# Patient Record
Sex: Male | Born: 1950
Health system: Southern US, Community
[De-identification: ages and names within clinical notes are randomized; demographics above are authoritative.]

## PROBLEM LIST (undated history)

## (undated) DIAGNOSIS — E781 Pure hyperglyceridemia: Secondary | ICD-10-CM

## (undated) DIAGNOSIS — Z125 Encounter for screening for malignant neoplasm of prostate: Secondary | ICD-10-CM

## (undated) DIAGNOSIS — M109 Gout, unspecified: Secondary | ICD-10-CM

## (undated) DIAGNOSIS — Z1211 Encounter for screening for malignant neoplasm of colon: Secondary | ICD-10-CM

## (undated) DIAGNOSIS — I10 Essential (primary) hypertension: Secondary | ICD-10-CM

## (undated) DIAGNOSIS — C689 Malignant neoplasm of urinary organ, unspecified: Secondary | ICD-10-CM

## (undated) HISTORY — DX: Gout, unspecified: M10.9

## (undated) HISTORY — DX: Encounter for screening for malignant neoplasm of colon: Z12.11

## (undated) HISTORY — DX: Encounter for screening for malignant neoplasm of prostate: Z12.5

## (undated) HISTORY — DX: Malignant neoplasm of urinary organ, unspecified: C68.9

## (undated) HISTORY — DX: Essential (primary) hypertension: I10

## (undated) HISTORY — DX: Pure hyperglyceridemia: E78.1

---

## 1992-04-30 DIAGNOSIS — C689 Malignant neoplasm of urinary organ, unspecified: Secondary | ICD-10-CM

## 1992-04-30 HISTORY — DX: Malignant neoplasm of urinary organ, unspecified: C68.9

## 2006-08-29 LAB — HM COLONOSCOPY: HM Colonoscopy: NORMAL

## 2006-09-09 ENCOUNTER — Ambulatory Visit: Payer: Self-pay | Admitting: Gastroenterology

## 2006-11-18 ENCOUNTER — Ambulatory Visit: Payer: Self-pay | Admitting: Internal Medicine

## 2008-05-15 IMAGING — CR DG LUMBAR SPINE AP/LAT/OBLIQUES W/ FLEX AND EXT
1 series · 5 of 5 positions shown · non-contrast
Comparison: none

REASON FOR EXAM: Pain, numbness
COMMENTS:

[Series 1: view not recorded · 0.17mm/px · 5 of 5 slices shown]
[im 1/5]
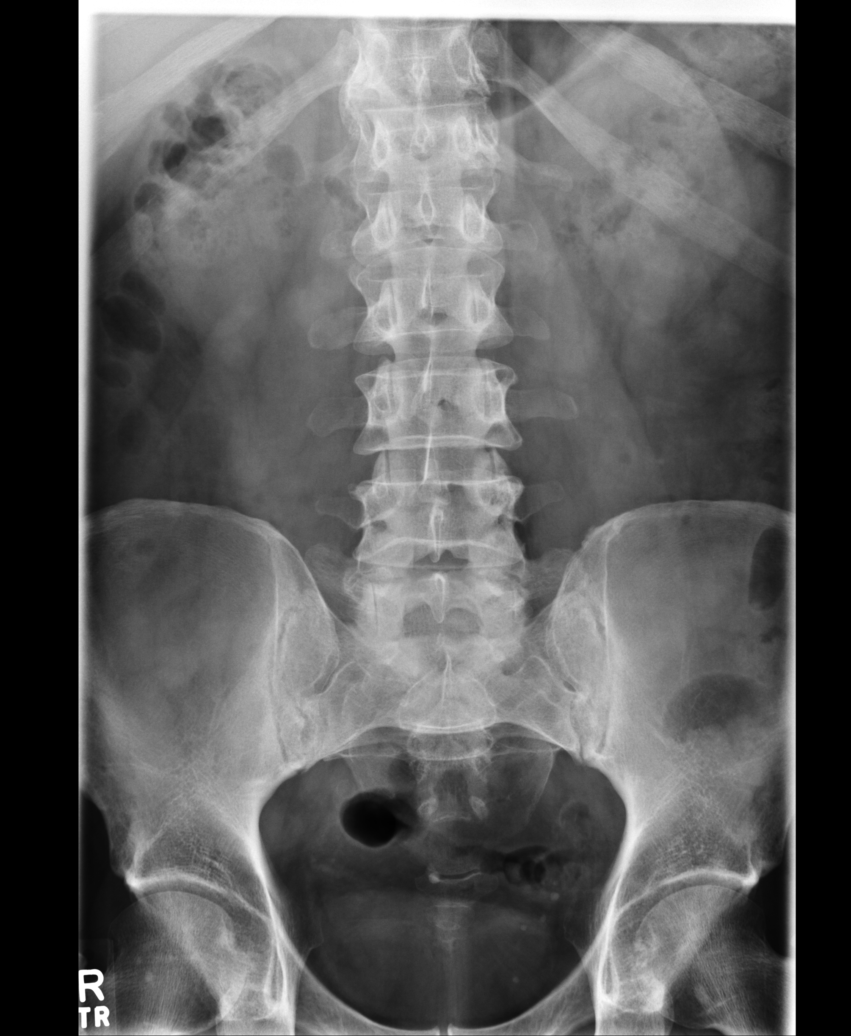
[im 2/5]
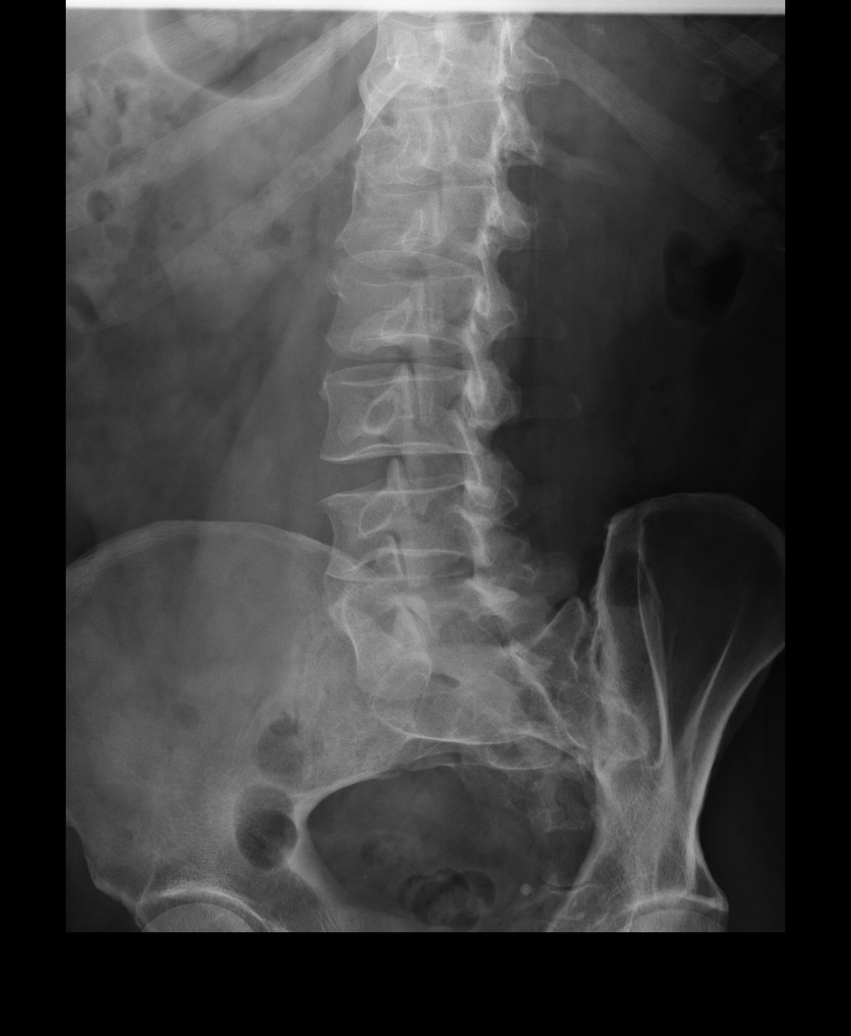
[im 3/5]
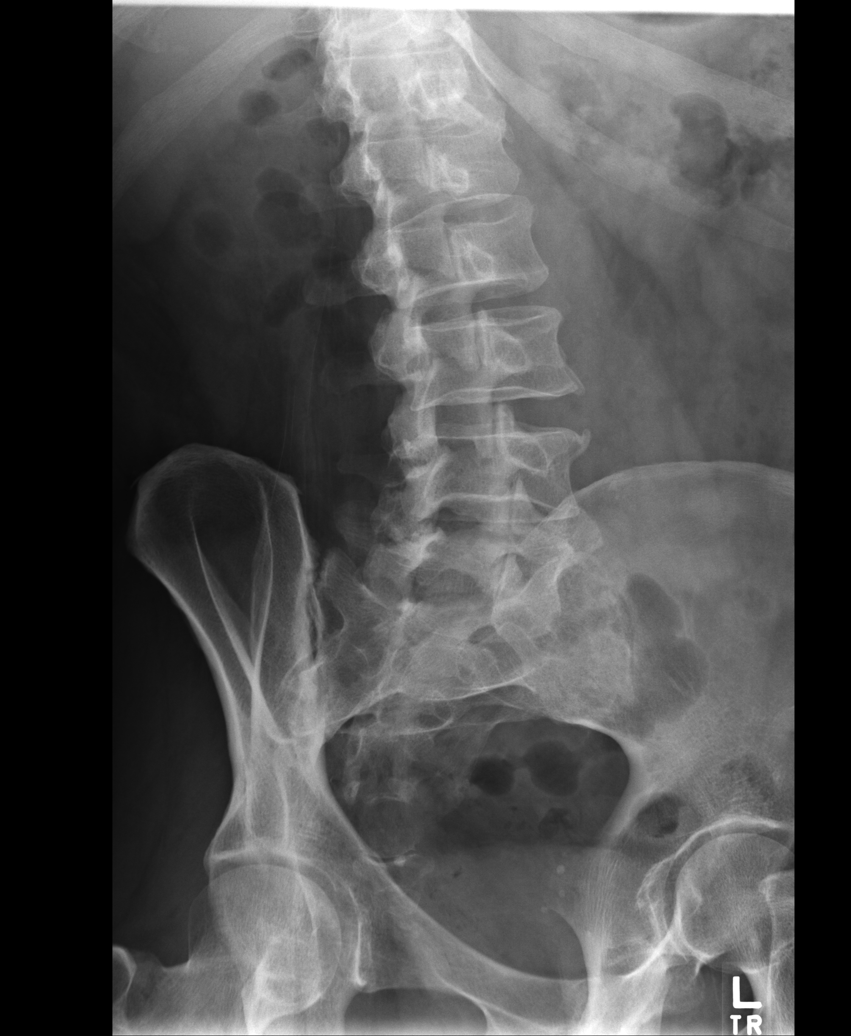
[im 4/5]
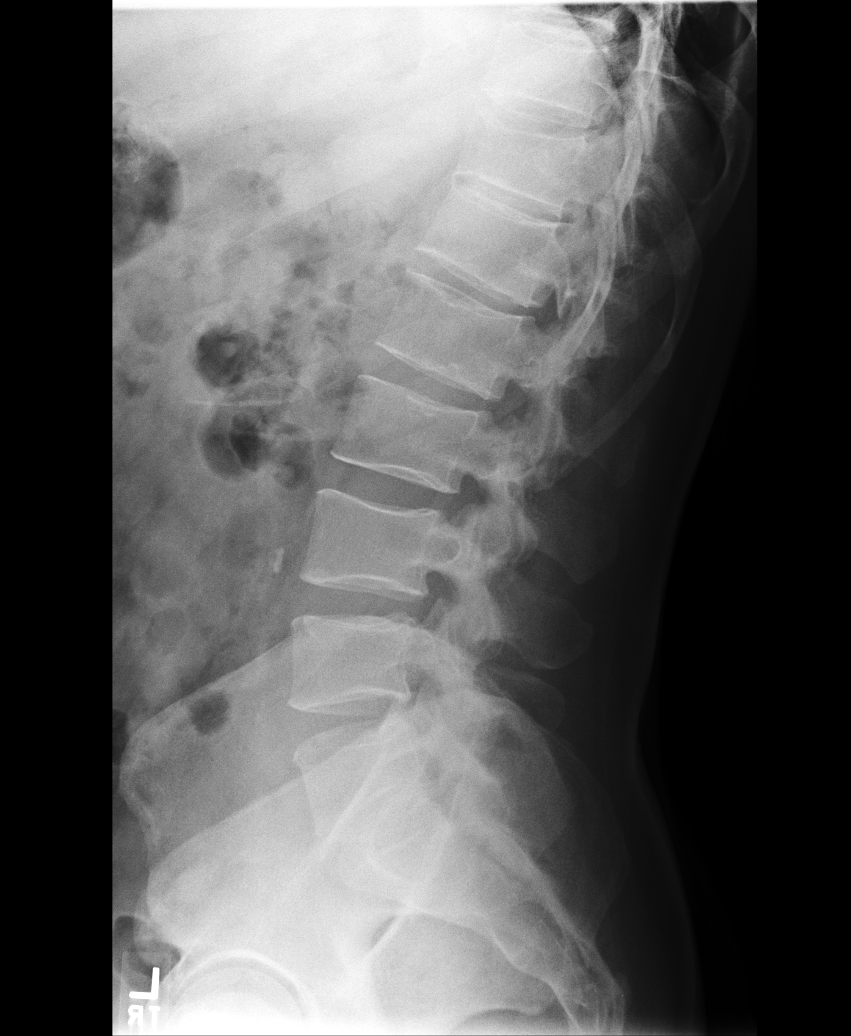
[im 5/5]
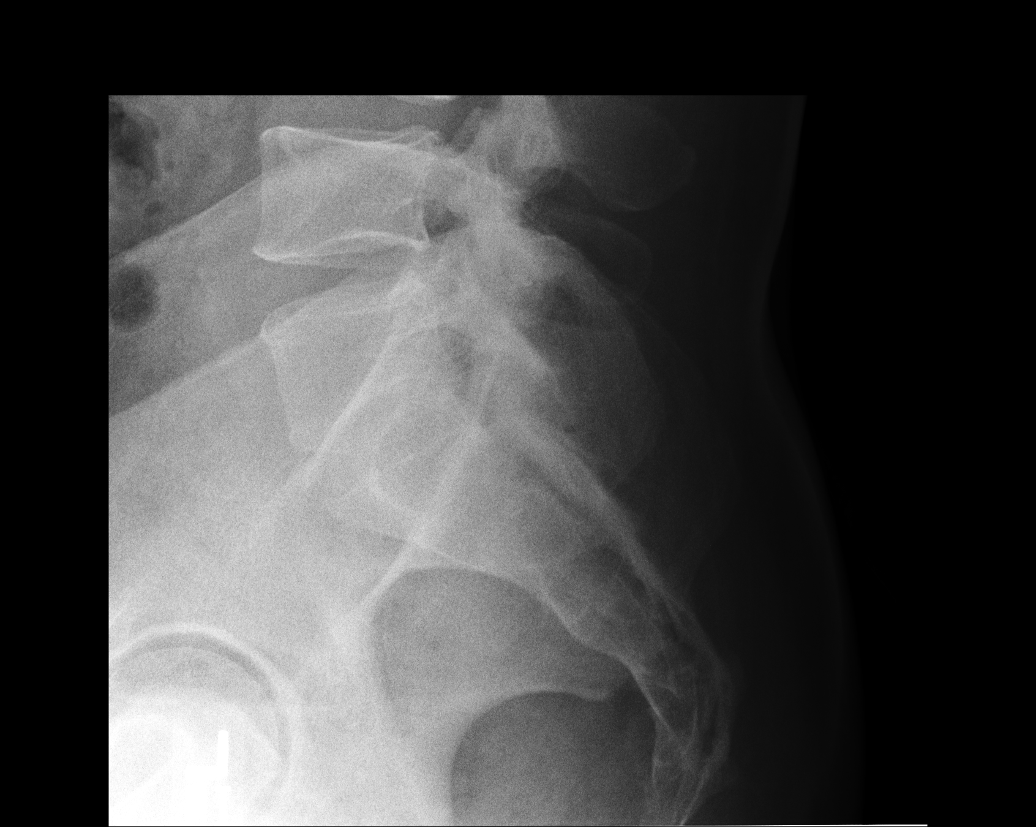

[5 of 5 positions shown; findings below may reference images not displayed]

PROCEDURE:     DXR - DXR LUMBAR SPINE WITH OBLIQUES  - November 18, 2006  [DATE]

RESULT:     The vertebral body heights are well maintained. There is slight
narrowing of the L4-5 intervertebral disc space. The change is minimal but
could represent early manifestation of disc disease. There is also noted
narrowing of the L5-S1 disc space which is likely developmental. Oblique
views show no show significant abnormalities of the articular facets. The
pedicles are intact. There appear to be vestigial ribs at T12.
IMPRESSION: 1.  No fracture is seen.
2.  There is very slight narrowing of the disc space at L4-5. The finding is
minimal but could represent early manifestation of disc disease. This could
be further evaluated by MR, if clinically indicated.

## 2011-01-15 ENCOUNTER — Other Ambulatory Visit: Payer: Self-pay | Admitting: Internal Medicine

## 2011-01-15 MED ORDER — ROSUVASTATIN CALCIUM 10 MG PO TABS: 10.0000 mg | ORAL_TABLET | Freq: Every day | ORAL | Status: DC

## 2011-02-12 ENCOUNTER — Other Ambulatory Visit: Payer: Self-pay | Admitting: Internal Medicine

## 2011-05-16 ENCOUNTER — Other Ambulatory Visit: Payer: Self-pay | Admitting: *Deleted

## 2011-05-16 NOTE — Telephone Encounter (Signed)
Faxed request from [facility] [place].  Pt hasn't been seen in this office and has no upcoming appts.

## 2011-05-16 NOTE — Telephone Encounter (Signed)
Please call p[atient and ask him to make appt and come in ASAP for fasting CMET and lipids,  I will authorized #30 days no refills for now

## 2011-05-22 ENCOUNTER — Other Ambulatory Visit: Payer: Self-pay | Admitting: *Deleted

## 2011-05-23 NOTE — Telephone Encounter (Signed)
Opened in error

## 2011-05-29 ENCOUNTER — Encounter: Payer: Self-pay | Admitting: Internal Medicine

## 2011-05-29 MED ORDER — ROSUVASTATIN CALCIUM 10 MG PO TABS: 10.0000 mg | ORAL_TABLET | Freq: Every day | ORAL | Status: DC

## 2011-05-29 NOTE — Telephone Encounter (Signed)
Advised pt, appt scheduled, medicine sent to pharmacy.

## 2011-05-30 ENCOUNTER — Encounter: Payer: Self-pay | Admitting: Internal Medicine

## 2011-06-13 ENCOUNTER — Ambulatory Visit (INDEPENDENT_AMBULATORY_CARE_PROVIDER_SITE_OTHER): Payer: PRIVATE HEALTH INSURANCE | Admitting: Internal Medicine

## 2011-06-13 ENCOUNTER — Encounter: Payer: Self-pay | Admitting: Internal Medicine

## 2011-06-13 DIAGNOSIS — E781 Pure hyperglyceridemia: Secondary | ICD-10-CM

## 2011-06-13 DIAGNOSIS — C801 Malignant (primary) neoplasm, unspecified: Secondary | ICD-10-CM

## 2011-06-13 DIAGNOSIS — I10 Essential (primary) hypertension: Secondary | ICD-10-CM

## 2011-06-13 DIAGNOSIS — C679 Malignant neoplasm of bladder, unspecified: Secondary | ICD-10-CM

## 2011-06-13 DIAGNOSIS — Z125 Encounter for screening for malignant neoplasm of prostate: Secondary | ICD-10-CM

## 2011-06-13 DIAGNOSIS — Z79899 Other long term (current) drug therapy: Secondary | ICD-10-CM

## 2011-06-13 DIAGNOSIS — C689 Malignant neoplasm of urinary organ, unspecified: Secondary | ICD-10-CM

## 2011-06-13 DIAGNOSIS — J329 Chronic sinusitis, unspecified: Secondary | ICD-10-CM

## 2011-06-13 DIAGNOSIS — Z0000 Encounter for general adult medical examination without abnormal findings: Secondary | ICD-10-CM

## 2011-06-13 DIAGNOSIS — Z1211 Encounter for screening for malignant neoplasm of colon: Secondary | ICD-10-CM

## 2011-06-13 DIAGNOSIS — E785 Hyperlipidemia, unspecified: Secondary | ICD-10-CM

## 2011-06-13 MED ORDER — AMOXICILLIN-POT CLAVULANATE 875-125 MG PO TABS: 1.0000 | ORAL_TABLET | Freq: Two times a day (BID) | ORAL | Status: AC

## 2011-06-13 MED ORDER — AMLODIPINE BESYLATE 10 MG PO TABS: 10.0000 mg | ORAL_TABLET | Freq: Every day | ORAL | Status: DC

## 2011-06-13 MED ORDER — ROSUVASTATIN CALCIUM 10 MG PO TABS: 10.0000 mg | ORAL_TABLET | Freq: Every day | ORAL | Status: DC

## 2011-06-13 NOTE — Patient Instructions (Signed)
I am treating  You with Augmentin for 7 days  ,  Take twice daily with food.  Use Simply Saline twice daily to flush sinuses.  Continue tylenol sinus or  Sudafed PE  10 to 30 mg every 6 hours for congestion.  Substitute Afrin nasal spray for the nighttime dose for 3 to 5  days max  Delsym as needed for cough,  Call for something stronger if needed.

## 2011-06-13 NOTE — Progress Notes (Signed)
Subjective:    Patient ID: Gerald Flores, male    DOB: [birth year], 61 y.o.   MRN: [id number]  HPI  Gerald Flores is a 61 year old white male with a history of former tobacco use gout and arthritis transitional cell carcinoma and hypertension who was last seen over 2012 presents today with chief complaint of recurrent sinus congestion,  Positive sick contacts,   4 or 5 weeks ago had right sided maxillary  Which resolved after one week.  His current episode has lasted 5 days and has progressed to facial pain,  conjunctival injection. purelent sinus drainage, low grade fevers.    Past Medical History  Diagnosis Date  . Special screening for malignant neoplasm of prostate   . Special screening for malignant neoplasms, colon   . Gouty arthritis   . Hypertension   . Hypertriglyceridemia   . Transitional cell carcinoma 1994    treated 1994, Gerald Flores   Current Outpatient Prescriptions on File Prior to Visit  Medication Sig Dispense Refill  . Multiple Vitamin (MULTIVITAMIN) tablet Take 1 tablet by mouth daily.      . Omega-3 Fatty Acids (FISH OIL) 1000 MG CAPS Take by mouth as directed      . tadalafil (CIALIS) 10 MG tablet Take 10 mg by mouth daily as needed.      . vitamin E 400 UNIT capsule Take 400 Units by mouth daily.       Review of Systems  Constitutional: Negative for fever, chills, diaphoresis, activity change, appetite change, fatigue and unexpected weight change.  HENT: Positive for ear pain, congestion, rhinorrhea and sinus pressure. Negative for hearing loss, nosebleeds, sore throat, facial swelling, sneezing, drooling, mouth sores, trouble swallowing, neck pain, neck stiffness, dental problem, voice change, postnasal drip, tinnitus and ear discharge.   Eyes: Positive for pain and redness. Negative for photophobia, discharge, itching and visual disturbance.  Respiratory: Negative for apnea, cough, choking, chest tightness, shortness of breath, wheezing and stridor.   Cardiovascular:  Negative for chest pain, palpitations and leg swelling.  Gastrointestinal: Negative for nausea, vomiting, abdominal pain, diarrhea, constipation, blood in stool, abdominal distention, anal bleeding and rectal pain.  Genitourinary: Negative for dysuria, urgency, frequency, hematuria, flank pain, decreased urine volume, scrotal swelling, difficulty urinating and testicular pain.  Musculoskeletal: Negative for myalgias, back pain, joint swelling, arthralgias and gait problem.  Skin: Negative for color change, rash and wound.  Neurological: Negative for dizziness, tremors, seizures, syncope, speech difficulty, weakness, light-headedness, numbness and headaches.  Psychiatric/Behavioral: Negative for suicidal ideas, hallucinations, behavioral problems, confusion, sleep disturbance, dysphoric mood, decreased concentration and agitation. The patient is not nervous/anxious.        Objective:   Physical Exam  Constitutional: He is oriented to person, place, and time.  HENT:  Head: Normocephalic and atraumatic.  Mouth/Throat: Oropharynx is clear and moist.  Eyes: Conjunctivae and EOM are normal.  Neck: Normal range of motion. Neck supple. No JVD present. No thyromegaly present.  Cardiovascular: Normal rate, regular rhythm and normal heart sounds.   Pulmonary/Chest: Effort normal and breath sounds normal. He has no wheezes. He has no rales.  Abdominal: Soft. Bowel sounds are normal. He exhibits no mass. There is no tenderness. There is no rebound.  Musculoskeletal: Normal range of motion. He exhibits no edema.  Neurological: He is alert and oriented to person, place, and time.  Skin: Skin is warm and dry.  Psychiatric: He has a normal mood and affect.          Assessment &  Plan:   Sinusitis Acute with recurrent symptoms of and prior resolution. We'll treat with Augmentin, decongestants and  is feeling a lot.  Hypertension Elevated today on current regimen, but he is acutely ill and has been  taking over-the-counter decongestants. I will have him recheck his pressure at work and, with several readings. Renal function was normal today  Hypertriglyceridemia Controlled with Crestor. He has not had liver function tests in one year. In her mind these need to be done at a minimum of 6 months to Ensure that he did not sustain liver damage from statin use. Liver functions today were normal.  Transitional cell carcinoma He has not had followup for transitional cell in several years. I have ordered a urinalysis to rule out hematuria and have suggested that he follow up with Gerald Flores annually.   Screening for prostate cancer He has not had a PSA in 2 years. Today's PSA is 1.95 digital rectal exam was deferred as he is not having the symptoms of enlargement.  Screening for colon cancer He normal colonoscopy in 2008 and has no family history of colon cancer. Followup in 2018.    Updated Medication List Outpatient Encounter Prescriptions as of 06/13/2011  Medication Sig Dispense Refill  . amLODipine (NORVASC) 10 MG tablet Take 1 tablet (10 mg total) by mouth daily.  30 tablet  6  . Multiple Vitamin (MULTIVITAMIN) tablet Take 1 tablet by mouth daily.      . Omega-3 Fatty Acids (FISH OIL) 1000 MG CAPS Take by mouth as directed      . rosuvastatin (CRESTOR) 10 MG tablet Take 1 tablet (10 mg total) by mouth at bedtime.  30 tablet  11  . tadalafil (CIALIS) 10 MG tablet Take 10 mg by mouth daily as needed.      Marland Kitchen DISCONTD: amLODipine (NORVASC) 5 MG tablet TAKE 1 TABLET EVERY DAY  30 tablet  6  . DISCONTD: rosuvastatin (CRESTOR) 10 MG tablet Take 1 tablet (10 mg total) by mouth at bedtime.  30 tablet  0  . amoxicillin-clavulanate (AUGMENTIN) 875-125 MG per tablet Take 1 tablet by mouth 2 (two) times daily.  14 tablet  0  . vitamin E 400 UNIT capsule Take 400 Units by mouth daily.

## 2011-06-14 LAB — COMPREHENSIVE METABOLIC PANEL
AST: 27 U/L (ref 0–37)
BUN: 14 mg/dL (ref 6–23)
Calcium: 9.1 mg/dL (ref 8.4–10.5)
Chloride: 106 mEq/L (ref 96–112)
Creatinine, Ser: 0.9 mg/dL (ref 0.4–1.5)
GFR: 89.71 mL/min (ref >60.00)

## 2011-06-14 LAB — URINALYSIS
Bilirubin Urine: NEGATIVE
Leukocytes, UA: NEGATIVE
Nitrite: NEGATIVE
Protein, ur: NEGATIVE mg/dL
Specific Gravity, Urine: 1.009 (ref 1.005–1.030)
Urobilinogen, UA: 0.2 mg/dL (ref 0.0–1.0)

## 2011-06-14 LAB — MICROALBUMIN / CREATININE URINE RATIO: Creatinine,U: 62.2 mg/dL

## 2011-06-14 LAB — LIPID PANEL
LDL Cholesterol: 102 mg/dL — ABNORMAL HIGH (ref 0–99)
Total CHOL/HDL Ratio: 4
VLDL: 20.2 mg/dL (ref 0.0–40.0)

## 2011-06-15 ENCOUNTER — Telehealth: Payer: Self-pay | Admitting: Internal Medicine

## 2011-06-15 NOTE — Telephone Encounter (Signed)
Pt called wanting to get his triiglyceride numbers [phone]

## 2011-06-17 ENCOUNTER — Encounter: Payer: Self-pay | Admitting: Internal Medicine

## 2011-06-17 DIAGNOSIS — I10 Essential (primary) hypertension: Secondary | ICD-10-CM | POA: Insufficient documentation

## 2011-06-17 DIAGNOSIS — J329 Chronic sinusitis, unspecified: Secondary | ICD-10-CM | POA: Insufficient documentation

## 2011-06-17 DIAGNOSIS — C689 Malignant neoplasm of urinary organ, unspecified: Secondary | ICD-10-CM | POA: Insufficient documentation

## 2011-06-17 DIAGNOSIS — E781 Pure hyperglyceridemia: Secondary | ICD-10-CM | POA: Insufficient documentation

## 2011-06-17 DIAGNOSIS — Z125 Encounter for screening for malignant neoplasm of prostate: Secondary | ICD-10-CM | POA: Insufficient documentation

## 2011-06-17 DIAGNOSIS — M109 Gout, unspecified: Secondary | ICD-10-CM | POA: Insufficient documentation

## 2011-06-17 DIAGNOSIS — Z1211 Encounter for screening for malignant neoplasm of colon: Secondary | ICD-10-CM | POA: Insufficient documentation

## 2011-06-17 NOTE — Assessment & Plan Note (Signed)
Elevated today on current regimen, but he is acutely ill and has been taking over-the-counter decongestants. I will have him recheck his pressure at work and, with several readings. Renal function was normal today

## 2011-06-17 NOTE — Assessment & Plan Note (Signed)
Controlled with Crestor. He has not had liver function tests in one year. In her mind these need to be done at a minimum of 6 months to Ensure that he did not sustain liver damage from statin use. Liver functions today were normal.

## 2011-06-17 NOTE — Assessment & Plan Note (Signed)
Acute with recurrent symptoms of and prior resolution. We'll treat with Augmentin, decongestants and  is feeling a lot.

## 2011-06-17 NOTE — Assessment & Plan Note (Signed)
He has not had a PSA in 2 years. Today's PSA is 1.95 digital rectal exam was deferred as he is not having the symptoms of enlargement.

## 2011-06-17 NOTE — Assessment & Plan Note (Addendum)
He has not had followup for transitional cell in several years. I have ordered a urinalysis to rule out hematuria and have suggested that he follow up with Dr. Evelene Croon annually.

## 2011-06-17 NOTE — Assessment & Plan Note (Signed)
He normal colonoscopy in 2008 and has no family history of colon cancer. Followup in 2018.

## 2011-06-18 NOTE — Telephone Encounter (Signed)
Advised pt of trig results, copy of labs placed up front for him to pick up.

## 2011-06-18 NOTE — Telephone Encounter (Signed)
Left message for patient to call.

## 2011-06-25 ENCOUNTER — Other Ambulatory Visit: Payer: Self-pay | Admitting: Internal Medicine

## 2011-06-25 MED ORDER — ROSUVASTATIN CALCIUM 10 MG PO TABS: 10.0000 mg | ORAL_TABLET | Freq: Every day | ORAL | Status: DC

## 2011-06-27 ENCOUNTER — Other Ambulatory Visit: Payer: Self-pay | Admitting: Internal Medicine

## 2011-06-28 ENCOUNTER — Encounter: Payer: Self-pay | Admitting: Internal Medicine

## 2011-09-10 ENCOUNTER — Other Ambulatory Visit: Payer: Self-pay | Admitting: Internal Medicine

## 2011-09-10 MED ORDER — ROSUVASTATIN CALCIUM 10 MG PO TABS: 10.0000 mg | ORAL_TABLET | Freq: Every day | ORAL | Status: DC

## 2012-04-14 ENCOUNTER — Other Ambulatory Visit: Payer: Self-pay | Admitting: Internal Medicine

## 2012-04-14 DIAGNOSIS — E785 Hyperlipidemia, unspecified: Secondary | ICD-10-CM

## 2012-04-14 MED ORDER — ROSUVASTATIN CALCIUM 10 MG PO TABS: 10.0000 mg | ORAL_TABLET | Freq: Every day | ORAL | Status: DC

## 2012-04-14 NOTE — Telephone Encounter (Signed)
30 days only Needs fasting lipids and CMET prior to any more refill.

## 2012-04-14 NOTE — Telephone Encounter (Signed)
°  Refill - Pharmacy -[facility] [address] Fax #[phone] Drug Name-Crestor 10 mg tab Directions- take 1 tab by mouth at bed time Quantity- 30

## 2012-04-14 NOTE — Telephone Encounter (Signed)
Refill request for Crestor 10 mg. No OV since 2/13. Ok for me  to refill?

## 2012-04-16 NOTE — Telephone Encounter (Signed)
Spoke to patient advised him of fasting labs due before any further refills.

## 2012-05-19 ENCOUNTER — Telehealth: Payer: Self-pay | Admitting: General Practice

## 2012-05-19 NOTE — Telephone Encounter (Signed)
Pt.notified

## 2012-05-19 NOTE — Telephone Encounter (Signed)
Pt called wanting to know if it would be ok to take Echinacea with his current medications?

## 2012-05-19 NOTE — Telephone Encounter (Signed)
Yes, and thank him for asking !

## 2012-06-14 ENCOUNTER — Other Ambulatory Visit: Payer: Self-pay

## 2012-09-08 ENCOUNTER — Other Ambulatory Visit: Payer: Self-pay | Admitting: Internal Medicine

## 2012-09-08 NOTE — Telephone Encounter (Signed)
Please advise patient not seen since 2/13

## 2012-12-08 ENCOUNTER — Ambulatory Visit (INDEPENDENT_AMBULATORY_CARE_PROVIDER_SITE_OTHER): Payer: Managed Care, Other (non HMO) | Admitting: Internal Medicine

## 2012-12-08 ENCOUNTER — Encounter: Payer: Self-pay | Admitting: Internal Medicine

## 2012-12-08 VITALS — BP 142/98 | HR 69 | Temp 98.1°F | Resp 12 | Ht 72.0 in | Wt 207.0 lb

## 2012-12-08 DIAGNOSIS — R200 Anesthesia of skin: Secondary | ICD-10-CM

## 2012-12-08 DIAGNOSIS — C801 Malignant (primary) neoplasm, unspecified: Secondary | ICD-10-CM

## 2012-12-08 DIAGNOSIS — Z125 Encounter for screening for malignant neoplasm of prostate: Secondary | ICD-10-CM

## 2012-12-08 DIAGNOSIS — I10 Essential (primary) hypertension: Secondary | ICD-10-CM

## 2012-12-08 DIAGNOSIS — Z1211 Encounter for screening for malignant neoplasm of colon: Secondary | ICD-10-CM

## 2012-12-08 DIAGNOSIS — R5383 Other fatigue: Secondary | ICD-10-CM

## 2012-12-08 DIAGNOSIS — Z79899 Other long term (current) drug therapy: Secondary | ICD-10-CM

## 2012-12-08 DIAGNOSIS — E785 Hyperlipidemia, unspecified: Secondary | ICD-10-CM

## 2012-12-08 DIAGNOSIS — E781 Pure hyperglyceridemia: Secondary | ICD-10-CM

## 2012-12-08 DIAGNOSIS — R202 Paresthesia of skin: Secondary | ICD-10-CM

## 2012-12-08 DIAGNOSIS — C689 Malignant neoplasm of urinary organ, unspecified: Secondary | ICD-10-CM

## 2012-12-08 DIAGNOSIS — Z0000 Encounter for general adult medical examination without abnormal findings: Secondary | ICD-10-CM | POA: Insufficient documentation

## 2012-12-08 DIAGNOSIS — Z8551 Personal history of malignant neoplasm of bladder: Secondary | ICD-10-CM

## 2012-12-08 DIAGNOSIS — R5381 Other malaise: Secondary | ICD-10-CM

## 2012-12-08 DIAGNOSIS — R209 Unspecified disturbances of skin sensation: Secondary | ICD-10-CM

## 2012-12-08 NOTE — Patient Instructions (Addendum)
Return tomorrow for your fasting labs.  i will refill your medications tonight  Please get your fasting labs and follow up visits  every 6 months

## 2012-12-08 NOTE — Progress Notes (Signed)
Patient ID: Gerald Flores, male   DOB: 1950/06/02, 62 y.o.   MRN: [id number]   Patient Active Problem List   Diagnosis Date Noted  . Routine general medical examination at a health care facility 12/08/2012  . Screening for prostate cancer 06/17/2011  . Screening for colon cancer 06/17/2011  . Gouty arthritis   . Hypertension   . Hypertriglyceridemia   . Transitional cell carcinoma     Subjective:  CC:   Chief Complaint  Patient presents with  . Follow-up    for medication    HPI:   Gerald Flores a 62 y.o. male who presents after being lost to follow up on hypertension and hyperlipidemia for over a year.   crestor  and amlodipine was not refilled  due to patient not having labs in over 1.5 years and he has been without them for about a month. Home bps have ben 130 to 140/80 to 90 without medications.    Iritis recovering right eye. Dr. Oren Bracket is treating him with topical prednisone drops.    Following a low carb diet.  History of 40 lb wt loss on low carb diet with regaining of most of his weight.   Last eye exam this year.       Past Medical History  Diagnosis Date  . Special screening for malignant neoplasm of prostate   . Special screening for malignant neoplasms, colon   . Gouty arthritis   . Hypertension   . Hypertriglyceridemia   . Transitional cell carcinoma 1994    treated 1994, Dr. Evelene Croon    History reviewed. No pertinent past surgical history.     The following portions of the patient's history were reviewed and updated as appropriate: Allergies, current medications, and problem list.    Review of Systems:   12 Pt  review of systems was negative except those addressed in the HPI,     History   Social History  . Marital Status: Married    Spouse Name: N/A    Number of Children: N/A  . Years of Education: N/A   Occupational History  . second shift supervisor    Social History Main Topics  . Smoking status: Former Smoker    Types: Pipe,  Software engineer  . Smokeless tobacco: Not on file     Comment: Quit 04/2006  . Alcohol Use: No  . Drug Use: Not on file  . Sexually Active: Not on file   Other Topics Concern  . Not on file   Social History Narrative   Lives with spouse, married May, 2009    Objective:  Filed Vitals:   12/08/12 1542  BP: 142/98  Pulse: 69  Temp: 98.1 F (36.7 C)  Resp: 12     General appearance: alert, cooperative and appears stated age Ears: normal TM's and external ear canals both ears Throat: lips, mucosa, and tongue normal; teeth and gums normal Neck: no adenopathy, no carotid bruit, supple, symmetrical, trachea midline and thyroid not enlarged, symmetric, no tenderness/mass/nodules Back: symmetric, no curvature. ROM normal. No CVA tenderness. Lungs: clear to auscultation bilaterally Heart: regular rate and rhythm, S1, S2 normal, no murmur, click, rub or gallop Abdomen: soft, non-tender; bowel sounds normal; no masses,  no organomegaly Pulses: 2+ and symmetric Skin: Skin color, texture, turgor normal. No rashes or lesions Lymph nodes: Cervical, supraclavicular, and axillary nodes normal.  Assessment and Plan:  Transitional cell carcinoma He has no had follow up with urology as advised last year.  Checking  UA today  Screening for prostate cancer He has deferred prostate exam,  No symptoms.  PSA ordered.  Hypertriglyceridemia Managed with crestor in the past. Statin stopped due to loss to follow up.  repeat lipids,  untreated, tomorrow  Hypertension Resume amlodipine ,  Checking BMET tomorrow  Routine general medical examination at a health care facility Annual male exam was done excluding testicular and prostate exam. PSA is pending .  Colon ca screening was reviewed home IFOB advised annually until next colonoscopy.    Updated Medication List Outpatient Encounter Prescriptions as of 12/08/2012  Medication Sig Dispense Refill  . amLODipine (NORVASC) 10 MG tablet TAKE 1 TABLET (10 MG  TOTAL) BY MOUTH DAILY.  30 tablet  4  . rosuvastatin (CRESTOR) 10 MG tablet Take 1 tablet (10 mg total) by mouth at bedtime.  30 tablet  0  . tadalafil (CIALIS) 10 MG tablet Take 10 mg by mouth daily as needed.      . Multiple Vitamin (MULTIVITAMIN) tablet Take 1 tablet by mouth daily.      . Omega-3 Fatty Acids (FISH OIL) 1000 MG CAPS Take by mouth as directed      . vitamin E 400 UNIT capsule Take 400 Units by mouth daily.       No facility-administered encounter medications on file as of 12/08/2012.     Orders Placed This Encounter  Procedures  . Fecal occult blood, imunochemical  . Urinalysis, Routine w reflex microscopic  . TSH  . CBC with Differential  . Comprehensive metabolic panel  . Lipid panel  . PSA  . B12 and Folate Panel    No Follow-up on file.

## 2012-12-08 NOTE — Assessment & Plan Note (Signed)
He has no had follow up with urology as advised last year.  Checking UA today

## 2012-12-08 NOTE — Assessment & Plan Note (Signed)
Managed with crestor in the past. Statin stopped due to loss to follow up.  repeat lipids,  untreated, tomorrow

## 2012-12-08 NOTE — Assessment & Plan Note (Signed)
Resume amlodipine ,  Checking BMET tomorrow

## 2012-12-08 NOTE — Assessment & Plan Note (Signed)
Annual male exam was done excluding testicular and prostate exam. PSA is pending .  Colon ca screening was reviewed home IFOB advised annually until next colonoscopy.

## 2012-12-08 NOTE — Assessment & Plan Note (Signed)
He has deferred prostate exam,  No symptoms.  PSA ordered.

## 2012-12-09 ENCOUNTER — Other Ambulatory Visit (INDEPENDENT_AMBULATORY_CARE_PROVIDER_SITE_OTHER): Payer: Managed Care, Other (non HMO)

## 2012-12-09 ENCOUNTER — Telehealth: Payer: Self-pay | Admitting: Internal Medicine

## 2012-12-09 DIAGNOSIS — R200 Anesthesia of skin: Secondary | ICD-10-CM

## 2012-12-09 DIAGNOSIS — Z79899 Other long term (current) drug therapy: Secondary | ICD-10-CM

## 2012-12-09 DIAGNOSIS — Z125 Encounter for screening for malignant neoplasm of prostate: Secondary | ICD-10-CM

## 2012-12-09 DIAGNOSIS — E785 Hyperlipidemia, unspecified: Secondary | ICD-10-CM

## 2012-12-09 DIAGNOSIS — R209 Unspecified disturbances of skin sensation: Secondary | ICD-10-CM

## 2012-12-09 DIAGNOSIS — R5381 Other malaise: Secondary | ICD-10-CM

## 2012-12-09 DIAGNOSIS — R5383 Other fatigue: Secondary | ICD-10-CM

## 2012-12-09 LAB — COMPREHENSIVE METABOLIC PANEL
ALT: 19 U/L (ref 0–53)
AST: 21 U/L (ref 0–37)
Alkaline Phosphatase: 51 U/L (ref 39–117)
Calcium: 9.1 mg/dL (ref 8.4–10.5)
Chloride: 103 mEq/L (ref 96–112)
Creatinine, Ser: 1 mg/dL (ref 0.4–1.5)

## 2012-12-09 LAB — B12 AND FOLATE PANEL: Vitamin B-12: 303 pg/mL (ref 211–911)

## 2012-12-09 LAB — CBC WITH DIFFERENTIAL/PLATELET
Basophils Absolute: 0.1 10*3/uL (ref 0.0–0.1)
Eosinophils Absolute: 0.1 10*3/uL (ref 0.0–0.7)
Hemoglobin: 14 g/dL (ref 13.0–17.0)
Lymphocytes Relative: 33 % (ref 12.0–46.0)
MCHC: 33.2 g/dL (ref 30.0–36.0)
Neutro Abs: 2.2 10*3/uL (ref 1.4–7.7)
Neutrophils Relative %: 53.1 % (ref 43.0–77.0)
RDW: 12.4 % (ref 11.5–14.6)

## 2012-12-09 LAB — LIPID PANEL
HDL: 41.1 mg/dL (ref >39.00)
Total CHOL/HDL Ratio: 6
VLDL: 35.6 mg/dL (ref 0.0–40.0)

## 2012-12-09 LAB — URINALYSIS, ROUTINE W REFLEX MICROSCOPIC
Bilirubin Urine: NEGATIVE
Ketones, ur: NEGATIVE
Leukocytes, UA: NEGATIVE
Nitrite: NEGATIVE
Specific Gravity, Urine: <=1.005 (ref 1.000–1.030)
Urobilinogen, UA: 0.2 (ref 0.0–1.0)

## 2012-12-09 LAB — PSA: PSA: 2.08 ng/mL (ref 0.10–4.00)

## 2012-12-09 NOTE — Telephone Encounter (Signed)
Pt would like his crestor and his bp meds sent to [facility] in [place] Pt is out of his meds. He thought they were called in yesterday.

## 2012-12-10 ENCOUNTER — Encounter: Payer: Self-pay | Admitting: Internal Medicine

## 2012-12-10 ENCOUNTER — Other Ambulatory Visit: Payer: Self-pay | Admitting: Internal Medicine

## 2012-12-10 DIAGNOSIS — E785 Hyperlipidemia, unspecified: Secondary | ICD-10-CM

## 2012-12-10 MED ORDER — ROSUVASTATIN CALCIUM 20 MG PO TABS: 20.0000 mg | ORAL_TABLET | Freq: Every day | ORAL | Status: DC

## 2012-12-10 MED ORDER — AMLODIPINE BESYLATE 10 MG PO TABS: ORAL_TABLET | ORAL | Status: DC

## 2012-12-10 NOTE — Telephone Encounter (Signed)
Scripts sent as requested

## 2013-03-05 ENCOUNTER — Other Ambulatory Visit: Payer: Self-pay

## 2013-05-14 ENCOUNTER — Other Ambulatory Visit: Payer: Self-pay | Admitting: *Deleted

## 2013-05-14 MED ORDER — AMLODIPINE BESYLATE 10 MG PO TABS: ORAL_TABLET | ORAL | Status: DC

## 2013-06-10 ENCOUNTER — Other Ambulatory Visit: Payer: Managed Care, Other (non HMO)

## 2013-06-15 ENCOUNTER — Ambulatory Visit: Payer: Managed Care, Other (non HMO) | Admitting: Internal Medicine

## 2013-06-16 ENCOUNTER — Other Ambulatory Visit: Payer: Managed Care, Other (non HMO)

## 2013-06-23 ENCOUNTER — Ambulatory Visit: Payer: Managed Care, Other (non HMO) | Admitting: Internal Medicine

## 2013-06-26 ENCOUNTER — Telehealth: Payer: Self-pay | Admitting: *Deleted

## 2013-06-26 DIAGNOSIS — D72819 Decreased white blood cell count, unspecified: Secondary | ICD-10-CM

## 2013-06-26 DIAGNOSIS — E559 Vitamin D deficiency, unspecified: Secondary | ICD-10-CM

## 2013-06-26 DIAGNOSIS — E785 Hyperlipidemia, unspecified: Secondary | ICD-10-CM

## 2013-06-26 DIAGNOSIS — Z79899 Other long term (current) drug therapy: Secondary | ICD-10-CM

## 2013-06-26 NOTE — Telephone Encounter (Signed)
Pt is coming in on Monday what labs and dx? 

## 2013-06-29 ENCOUNTER — Other Ambulatory Visit (INDEPENDENT_AMBULATORY_CARE_PROVIDER_SITE_OTHER): Payer: Managed Care, Other (non HMO)

## 2013-06-29 DIAGNOSIS — E559 Vitamin D deficiency, unspecified: Secondary | ICD-10-CM

## 2013-06-29 DIAGNOSIS — E785 Hyperlipidemia, unspecified: Secondary | ICD-10-CM

## 2013-06-29 DIAGNOSIS — D72819 Decreased white blood cell count, unspecified: Secondary | ICD-10-CM

## 2013-06-29 DIAGNOSIS — Z79899 Other long term (current) drug therapy: Secondary | ICD-10-CM

## 2013-06-30 LAB — LIPID PANEL
CHOL/HDL RATIO: 6
Cholesterol: 253 mg/dL — ABNORMAL HIGH (ref 0–200)
HDL: 43.9 mg/dL (ref >39.00)
LDL Cholesterol: 170 mg/dL — ABNORMAL HIGH (ref 0–99)
Triglycerides: 195 mg/dL — ABNORMAL HIGH (ref 0.0–149.0)
VLDL: 39 mg/dL (ref 0.0–40.0)

## 2013-06-30 LAB — CBC WITH DIFFERENTIAL/PLATELET
BASOS ABS: 0.1 10*3/uL (ref 0.0–0.1)
Basophils Relative: 1.1 % (ref 0.0–3.0)
EOS ABS: 0.1 10*3/uL (ref 0.0–0.7)
Eosinophils Relative: 1.9 % (ref 0.0–5.0)
HCT: 42.8 % (ref 39.0–52.0)
Hemoglobin: 14.3 g/dL (ref 13.0–17.0)
Lymphocytes Relative: 26.2 % (ref 12.0–46.0)
Lymphs Abs: 1.6 10*3/uL (ref 0.7–4.0)
MCHC: 33.3 g/dL (ref 30.0–36.0)
MCV: 91.2 fl (ref 78.0–100.0)
Monocytes Absolute: 0.5 10*3/uL (ref 0.1–1.0)
Monocytes Relative: 7.4 % (ref 3.0–12.0)
NEUTROS ABS: 3.9 10*3/uL (ref 1.4–7.7)
Neutrophils Relative %: 63.4 % (ref 43.0–77.0)
Platelets: 257 10*3/uL (ref 150.0–400.0)
RBC: 4.69 Mil/uL (ref 4.22–5.81)
RDW: 12.8 % (ref 11.5–14.6)
WBC: 6.1 10*3/uL (ref 4.5–10.5)

## 2013-06-30 LAB — VITAMIN D 25 HYDROXY (VIT D DEFICIENCY, FRACTURES): VIT D 25 HYDROXY: 41 ng/mL (ref 30–89)

## 2013-06-30 LAB — COMPREHENSIVE METABOLIC PANEL
ALT: 17 U/L (ref 0–53)
AST: 21 U/L (ref 0–37)
Albumin: 4 g/dL (ref 3.5–5.2)
Alkaline Phosphatase: 53 U/L (ref 39–117)
BILIRUBIN TOTAL: 0.9 mg/dL (ref 0.3–1.2)
BUN: 16 mg/dL (ref 6–23)
CALCIUM: 9.3 mg/dL (ref 8.4–10.5)
CHLORIDE: 104 meq/L (ref 96–112)
CO2: 28 mEq/L (ref 19–32)
CREATININE: 0.9 mg/dL (ref 0.4–1.5)
GFR: 90.27 mL/min (ref >60.00)
Glucose, Bld: 82 mg/dL (ref 70–99)
Potassium: 4.4 mEq/L (ref 3.5–5.1)
SODIUM: 138 meq/L (ref 135–145)
TOTAL PROTEIN: 7.1 g/dL (ref 6.0–8.3)

## 2013-07-01 ENCOUNTER — Encounter: Payer: Self-pay | Admitting: Internal Medicine

## 2013-07-01 ENCOUNTER — Ambulatory Visit (INDEPENDENT_AMBULATORY_CARE_PROVIDER_SITE_OTHER): Payer: Managed Care, Other (non HMO) | Admitting: Internal Medicine

## 2013-07-01 VITALS — BP 172/104 | HR 67 | Temp 98.1°F | Resp 16 | Wt 219.5 lb

## 2013-07-01 DIAGNOSIS — E781 Pure hyperglyceridemia: Secondary | ICD-10-CM

## 2013-07-01 DIAGNOSIS — I10 Essential (primary) hypertension: Secondary | ICD-10-CM

## 2013-07-01 DIAGNOSIS — Z23 Encounter for immunization: Secondary | ICD-10-CM

## 2013-07-01 DIAGNOSIS — E785 Hyperlipidemia, unspecified: Secondary | ICD-10-CM

## 2013-07-01 DIAGNOSIS — R635 Abnormal weight gain: Secondary | ICD-10-CM

## 2013-07-01 MED ORDER — COENZYME Q10 30 MG PO CAPS
30.0000 mg | ORAL_CAPSULE | Freq: Every day | ORAL | Status: DC
Start: 2013-07-01 — End: 2015-09-27

## 2013-07-01 MED ORDER — AMLODIPINE BESYLATE 10 MG PO TABS: ORAL_TABLET | ORAL | Status: DC

## 2013-07-01 MED ORDER — ROSUVASTATIN CALCIUM 20 MG PO TABS: 20.0000 mg | ORAL_TABLET | Freq: Every day | ORAL | Status: DC

## 2013-07-01 NOTE — Progress Notes (Signed)
Patient ID: Gerald Flores, male   DOB: 05/28/1950, 63 y.o.   MRN: [id number]  Patient Active Problem List   Diagnosis Date Noted  . Weight gain 07/04/2013  . Routine general medical examination at a health care facility 12/08/2012  . Screening for prostate cancer 06/17/2011  . Screening for colon cancer 06/17/2011  . Gouty arthritis   . Hypertension   . Hypertriglyceridemia   . Transitional cell carcinoma     Subjective:  CC:   Chief Complaint  Patient presents with  . Follow-up    general follow up    HPI:   Gerald Flores is a 63 y.o. male who presents for Follow up on chronic conditions including hypertension and hyperlipidemia.  Last seen August 2014,  Has run out of his BP medications as of 6 weeks ago.  Cc: Low back pain  Has been a problem for several months.  The pain is nonradiating./  Improving with [facility] chiropractic manipulation.    He has not taking crestor due to recurrent myalgias which returned with repeat trial .  He also states that he has not hiad his  bp meds in 6 weeks    Past Medical History  Diagnosis Date  . Special screening for malignant neoplasm of prostate   . Special screening for malignant neoplasms, colon   . Gouty arthritis   . Hypertension   . Hypertriglyceridemia   . Transitional cell carcinoma 1994    treated 1994, Dr. Yves Dill    No past surgical history on file.     The following portions of the patient's history were reviewed and updated as appropriate: Allergies, current medications, and problem list.    Review of Systems:   Patient denies headache, fevers, malaise, unintentional weight loss, skin rash, eye pain, sinus congestion and sinus pain, sore throat, dysphagia,  hemoptysis , cough, dyspnea, wheezing, chest pain, palpitations, orthopnea, edema, abdominal pain, nausea, melena, diarrhea, constipation, flank pain, dysuria, hematuria, urinary  Frequency, nocturia, numbness, tingling, seizures,  Focal weakness, Loss of  consciousness,  Tremor, insomnia, depression, anxiety, and suicidal ideation.     History   Social History  . Marital Status: Married    Spouse Name: N/A    Number of Children: N/A  . Years of Education: N/A   Occupational History  . second shift supervisor    Social History Main Topics  . Smoking status: Former Smoker    Types: Pipe, Landscape architect  . Smokeless tobacco: Not on file     Comment: Quit 04/2006  . Alcohol Use: No  . Drug Use: Not on file  . Sexual Activity: Not on file   Other Topics Concern  . Not on file   Social History Narrative   Lives with spouse, married May, 2009    Objective:  Filed Vitals:   07/01/13 1636  BP: 172/104  Pulse:   Temp:   Resp:      General appearance: alert, cooperative and appears stated age Ears: normal TM's and external ear canals both ears Throat: lips, mucosa, and tongue normal; teeth and gums normal Neck: no adenopathy, no carotid bruit, supple, symmetrical, trachea midline and thyroid not enlarged, symmetric, no tenderness/mass/nodules Back: symmetric, no curvature. ROM normal. No CVA tenderness. Lungs: clear to auscultation bilaterally Heart: regular rate and rhythm, S1, S2 normal, no murmur, click, rub or gallop Abdomen: soft, non-tender; bowel sounds normal; no masses,  no organomegaly Pulses: 2+ and symmetric Skin: Skin color, texture, turgor normal. No rashes or lesions Lymph  nodes: Cervical, supraclavicular, and axillary nodes normal.  Assessment and Plan:  Hypertriglyceridemia He did not tolerate daily Crestor due to myalgias.  Repeat lipids are improved and trigs are < 200 ,  Ldl 170.  He will try adding coQ 10 and taking the Crestor every other day Lab Results  Component Value Date   CHOL 253* 06/29/2013   HDL 43.90 06/29/2013   LDLCALC 170* 06/29/2013   LDLDIRECT 136.4 12/09/2012   TRIG 195.0* 06/29/2013   CHOLHDL 6 06/29/2013    Hypertension  Historically well controlled on current regimen but currently  untreated.  Samples of Bystolic given , 10 mg daily until mail order amlodipine . Renal function stable, no changes today.  Lab Results  Component Value Date   CREATININE 0.9 06/29/2013   Lab Results  Component Value Date   NA 138 06/29/2013   K 4.4 06/29/2013   CL 104 06/29/2013   CO2 28 06/29/2013     Weight gain I have addressed  BMI and recommended wt loss of 10% of body weigh over the next 6 months using a low glycemic index diet and regular exercise a minimum of 5 days per week.   A total of 25 minutes was spent with patient more than half of which was spent in counseling, reviewing records from other prviders and coordination of care.   Updated Medication List Outpatient Encounter Prescriptions as of 07/01/2013  Medication Sig  . amLODipine (NORVASC) 10 MG tablet TAKE 1 TABLET (10 MG TOTAL) BY MOUTH DAILY.  . Multiple Vitamin (MULTIVITAMIN) tablet Take 1 tablet by mouth daily.  . Omega-3 Fatty Acids (FISH OIL) 1000 MG CAPS Take by mouth as directed  . rosuvastatin (CRESTOR) 20 MG tablet Take 1 tablet (20 mg total) by mouth at bedtime.  . tadalafil (CIALIS) 10 MG tablet Take 10 mg by mouth daily as needed.  . vitamin E 400 UNIT capsule Take 400 Units by mouth daily.  . [DISCONTINUED] amLODipine (NORVASC) 10 MG tablet TAKE 1 TABLET (10 MG TOTAL) BY MOUTH DAILY.  . [DISCONTINUED] rosuvastatin (CRESTOR) 20 MG tablet Take 1 tablet (20 mg total) by mouth at bedtime.  Marland Kitchen co-enzyme Q-10 30 MG capsule Take 1 capsule (30 mg total) by mouth daily.     Orders Placed This Encounter  Procedures  . Tdap vaccine greater than or equal to 7yo IM    Return in about 6 months (around 01/01/2014).

## 2013-07-01 NOTE — Patient Instructions (Signed)
Let's resume crestor every other day and add daily co Q 10 (supplement0 to help the muscles  Please  Take bystolic 10 mg (that's 2 tablets) daily at bedtime until your amlodipine arrives  Your goal blood pressure is 130/80 or less .  Call me if you are not at goal after you resume amlodipine  Repeat labs needed in 2 months (fasting)  Return in 6 months for your annual physical  Ignore your family!! Your wife is right   This is  One version of a  "Low GI"  Diet:  It will still lower your blood sugars and allow you to lose 4 to 8  lbs  per month if you follow it carefully.  Your goal with exercise is a minimum of 30 minutes of aerobic exercise 5 days per week (Walking does not count once it becomes easy!)    All of the foods can be found at grocery stores and in bulk at [employer].  The Atkins protein bars and shakes are available in more varieties at Target, [employer] and [place].     7 AM Breakfast:  Choose from the following:  Low carbohydrate Protein  Shakes (I recommend the EAS AdvantEdge "Carb Control" shakes  Or the low carb shakes by Atkins.    2.5 carbs   Arnold's "Sandwhich Thin"toasted  w/ peanut butter (no jelly: about 20 net carbs  "Bagel Thin" with cream cheese and salmon: about 20 carbs   a scrambled egg/bacon/cheese burrito made with Mission's "carb balance" whole wheat tortilla  (about 10 net carbs )   Avoid cereal and bananas, oatmeal and cream of wheat and grits. They are loaded with carbohydrates!   10 AM: high protein snack  Protein bar by Atkins (the snack size, under 200 cal, usually < 6 net carbs).    A stick of cheese:  Around 1 carb,  100 cal     Dannon Light n Fit [place] Yogurt  (80 cal, 8 carbs)  Other so called "protein bars" and Greek yogurts tend to be loaded with carbohydrates.  Remember, in food advertising, the word "energy" is synonymous for " carbohydrate."  Lunch:   A Sandwich using the bread choices listed, Can use any  Eggs,  lunchmeat, grilled  meat or canned tuna), avocado, regular mayo/mustard  and cheese.  A Salad using blue cheese, ranch,  Goddess or vinagrette,  No croutons or "confetti" and no "candied nuts" but regular nuts OK.   No pretzels or chips.  Pickles and miniature sweet peppers are a good low carb alternative that provide a "crunch"  The bread is the only source of carbohydrate in a sandwich and  can be decreased by trying some of these alternatives to traditional loaf bread  Joseph's makes a pita bread and a flat bread that are 50 cal and 4 net carbs available at [place] and [place].  This can be toasted to use with hummous as well  Toufayan makes a low carb flatbread that's 100 cal and 9 net carbs available at [employer] and [employer]J's makes 2 sizes of  Low carb whole wheat tortilla  (The large one is 210 cal and 6 net carbs) Avoid "Low fat dressings, as well as Barry Brunner and [place] dressings They are loaded with sugar!   3 PM/ Mid day  Snack:  Consider  1 ounce of  almonds, walnuts, pistachios, pecans, peanuts,  Macadamia nuts or a nut medley.  Avoid "granola"; the dried cranberries and  raisins are loaded with carbohydrates. Mixed nuts as long as there are no raisins,  cranberries or dried fruit.     6 PM  Dinner:     Meat/fowl/fish with a green salad, and either broccoli, cauliflower, green beans, spinach, brussel sprouts or  Lima beans. DO NOT BREAD THE PROTEIN!!      There is a low carb pasta by Dreamfield's that is acceptable and tastes great: only 5 digestible carbs/serving.( All grocery stores but BJs carry it )  Try Hurley Cisco Angelo's chicken piccata or chicken or eggplant parm over low carb pasta.(Lowes and BJs)   Marjory Lies Sanchez's "Carnitas" (pulled pork, no sauce,  0 carbs) or his beef pot roast to make a dinner burrito (at [employer]J's)  Pesto over low carb pasta (bj's sells a good quality pesto in the center refrigerated section of the deli   Whole wheat pasta is still full of digestible carbs and  Not as low  in glycemic index as Dreamfield's.   Brown rice is still rice,  So skip the rice and noodles if you eat [place] or [place] (or at least limit to 1/2 cup)  9 PM snack :   Breyer's "low carb" fudgsicle or  ice cream bar (Carb Smart line), or  Weight Watcher's ice cream bar , or another "no sugar added" ice cream;  a serving of fresh berries/cherries with whipped cream   Try layering  DANNON'S LlGHT N FIT GREEK YOGURT with blueberries walnuts and whipped cream for a great dessert  Avoid bananas, pineapple, grapes  and watermelon on a regular basis because they are high in sugar.  THINK OF THEM AS DESSERT  Remember that snack Substitutions should be less than 10 NET carbs per serving and meals < 20 carbs. Remember to subtract fiber grams to get the "net carbs."

## 2013-07-01 NOTE — Progress Notes (Signed)
Pre-visit discussion using our clinic review tool. No additional management support is needed unless otherwise documented below in the visit note.  

## 2013-07-04 DIAGNOSIS — E663 Overweight: Secondary | ICD-10-CM | POA: Insufficient documentation

## 2013-07-04 NOTE — Assessment & Plan Note (Addendum)
Historically well controlled on current regimen but currently untreated.  Samples of Bystolic given , 10 mg daily until mail order amlodipine . Renal function stable, no changes today.  Lab Results  Component Value Date   CREATININE 0.9 06/29/2013   Lab Results  Component Value Date   NA 138 06/29/2013   K 4.4 06/29/2013   CL 104 06/29/2013   CO2 28 06/29/2013

## 2013-07-04 NOTE — Assessment & Plan Note (Addendum)
He did not tolerate daily Crestor due to myalgias.  Repeat lipids are improved and trigs are < 200 ,  Ldl 170.  He will try adding coQ 10 and taking the Crestor every other day Lab Results  Component Value Date   CHOL 253* 06/29/2013   HDL 43.90 06/29/2013   LDLCALC 170* 06/29/2013   LDLDIRECT 136.4 12/09/2012   TRIG 195.0* 06/29/2013   CHOLHDL 6 06/29/2013

## 2013-07-04 NOTE — Assessment & Plan Note (Signed)
I have addressed  BMI and recommended wt loss of 10% of body weigh over the next 6 months using a low glycemic index diet and regular exercise a minimum of 5 days per week.   

## 2013-07-24 ENCOUNTER — Telehealth: Payer: Self-pay | Admitting: *Deleted

## 2013-07-24 DIAGNOSIS — M109 Gout, unspecified: Secondary | ICD-10-CM

## 2013-07-24 MED ORDER — COLCHICINE 0.6 MG PO TABS: 0.6000 mg | ORAL_TABLET | Freq: Two times a day (BID) | ORAL | Status: DC

## 2013-07-24 NOTE — Telephone Encounter (Signed)
Patient called and stated he is having a gout flare up in his right great toe and stated he had discussed this at last visit  And you would call something for gout.

## 2013-07-24 NOTE — Telephone Encounter (Signed)
Please send medication to [facility] [address]

## 2013-07-24 NOTE — Telephone Encounter (Signed)
Colchicine twice daily  For a week,  called in ,  May add tylenol 500 mg every 6 hours  PLEASE suspend his cholesterol medication for two weeks   Starting now

## 2013-07-24 NOTE — Telephone Encounter (Signed)
Patient notified script called. Explained to patient to stop Crestor for two weeks patient voiced understanding.

## 2013-08-31 ENCOUNTER — Other Ambulatory Visit (INDEPENDENT_AMBULATORY_CARE_PROVIDER_SITE_OTHER): Payer: Managed Care, Other (non HMO)

## 2013-08-31 ENCOUNTER — Other Ambulatory Visit: Payer: Self-pay | Admitting: Internal Medicine

## 2013-08-31 DIAGNOSIS — E785 Hyperlipidemia, unspecified: Secondary | ICD-10-CM

## 2013-08-31 DIAGNOSIS — Z79899 Other long term (current) drug therapy: Secondary | ICD-10-CM

## 2013-08-31 LAB — COMPREHENSIVE METABOLIC PANEL
ALT: 28 U/L (ref 0–53)
AST: 38 U/L — ABNORMAL HIGH (ref 0–37)
Albumin: 3.9 g/dL (ref 3.5–5.2)
Alkaline Phosphatase: 55 U/L (ref 39–117)
BILIRUBIN TOTAL: 0.8 mg/dL (ref 0.2–1.2)
BUN: 12 mg/dL (ref 6–23)
CALCIUM: 9.1 mg/dL (ref 8.4–10.5)
CHLORIDE: 106 meq/L (ref 96–112)
CO2: 27 meq/L (ref 19–32)
Creatinine, Ser: 1 mg/dL (ref 0.4–1.5)
GFR: 81.78 mL/min (ref >60.00)
Glucose, Bld: 91 mg/dL (ref 70–99)
Potassium: 4.1 mEq/L (ref 3.5–5.1)
SODIUM: 141 meq/L (ref 135–145)
TOTAL PROTEIN: 6.5 g/dL (ref 6.0–8.3)

## 2013-09-01 LAB — LIPID PANEL W/O CHOL/HDL RATIO
Cholesterol, Total: 175 mg/dL (ref 100–199)
HDL: 50 mg/dL (ref >39)
LDL Calculated: 96 mg/dL (ref 0–99)
Triglycerides: 147 mg/dL (ref 0–149)
VLDL Cholesterol Cal: 29 mg/dL (ref 5–40)

## 2013-09-02 ENCOUNTER — Encounter: Payer: Self-pay | Admitting: Internal Medicine

## 2014-01-01 ENCOUNTER — Encounter: Payer: Managed Care, Other (non HMO) | Admitting: Internal Medicine

## 2014-02-05 ENCOUNTER — Encounter: Payer: Self-pay | Admitting: Internal Medicine

## 2014-02-05 ENCOUNTER — Ambulatory Visit (INDEPENDENT_AMBULATORY_CARE_PROVIDER_SITE_OTHER): Payer: Managed Care, Other (non HMO) | Admitting: Internal Medicine

## 2014-02-05 VITALS — BP 126/76 | HR 86 | Temp 97.7°F | Resp 16 | Ht 72.0 in | Wt 219.2 lb

## 2014-02-05 DIAGNOSIS — Z125 Encounter for screening for malignant neoplasm of prostate: Secondary | ICD-10-CM

## 2014-02-05 DIAGNOSIS — C801 Malignant (primary) neoplasm, unspecified: Secondary | ICD-10-CM

## 2014-02-05 DIAGNOSIS — E559 Vitamin D deficiency, unspecified: Secondary | ICD-10-CM

## 2014-02-05 DIAGNOSIS — I10 Essential (primary) hypertension: Secondary | ICD-10-CM

## 2014-02-05 DIAGNOSIS — Z0000 Encounter for general adult medical examination without abnormal findings: Secondary | ICD-10-CM

## 2014-02-05 DIAGNOSIS — Z1159 Encounter for screening for other viral diseases: Secondary | ICD-10-CM

## 2014-02-05 DIAGNOSIS — C689 Malignant neoplasm of urinary organ, unspecified: Secondary | ICD-10-CM

## 2014-02-05 DIAGNOSIS — Z79899 Other long term (current) drug therapy: Secondary | ICD-10-CM

## 2014-02-05 DIAGNOSIS — E781 Pure hyperglyceridemia: Secondary | ICD-10-CM

## 2014-02-05 DIAGNOSIS — R635 Abnormal weight gain: Secondary | ICD-10-CM

## 2014-02-05 LAB — PSA: PSA: 2.53 ng/mL (ref 0.10–4.00)

## 2014-02-05 LAB — COMPREHENSIVE METABOLIC PANEL
ALBUMIN: 3.7 g/dL (ref 3.5–5.2)
ALT: 27 U/L (ref 0–53)
AST: 33 U/L (ref 0–37)
Alkaline Phosphatase: 71 U/L (ref 39–117)
BUN: 14 mg/dL (ref 6–23)
CHLORIDE: 104 meq/L (ref 96–112)
CO2: 28 mEq/L (ref 19–32)
CREATININE: 0.9 mg/dL (ref 0.4–1.5)
Calcium: 9.3 mg/dL (ref 8.4–10.5)
GFR: 87.84 mL/min (ref >60.00)
GLUCOSE: 94 mg/dL (ref 70–99)
Potassium: 4.2 mEq/L (ref 3.5–5.1)
Sodium: 138 mEq/L (ref 135–145)
Total Bilirubin: 0.9 mg/dL (ref 0.2–1.2)
Total Protein: 7.6 g/dL (ref 6.0–8.3)

## 2014-02-05 LAB — VITAMIN D 25 HYDROXY (VIT D DEFICIENCY, FRACTURES): VITD: 30.35 ng/mL (ref 30.00–100.00)

## 2014-02-05 NOTE — Progress Notes (Signed)
Pre-visit discussion using our clinic review tool. No additional management support is needed unless otherwise documented below in the visit note.  

## 2014-02-05 NOTE — Patient Instructions (Addendum)
This is  my version of a  "Low GI"  Diet:  It will still lower your blood sugars and allow you to lose 4 to 8  lbs  per month if you follow it carefully.  Your goal with exercise is a minimum of 30 minutes of aerobic exercise 5 days per week (Walking does not count once it becomes easy!)     All of the foods can be found at grocery stores and in bulk at [employer].  The Atkins protein bars and shakes are available in more varieties at Target, WalMart and [place].     7 AM Breakfast:  Choose from the following:  Low carbohydrate Protein  Shakes (I recommend the EAS AdvantEdge "Carb Control" shakes  Or the low carb shakes by Atkins.    2.5 carbs   Arnold's "Sandwhich Thin"toasted  w/ peanut butter (no jelly: about 20 net carbs  "Bagel Thin" with cream cheese and salmon: about 20 carbs   a scrambled egg/bacon/cheese burrito made with [employer]'s "carb balance" whole wheat tortilla  (about 10 net carbs )  A slice of home made fritatta (egg based dish without a crust:  google it)    Avoid cereal and bananas, oatmeal and cream of wheat and grits. They are loaded with carbohydrates!   10 AM: high protein snack  Protein bar by Atkins (the snack size, under 200 cal, usually < 6 net carbs).    A stick of cheese:  Around 1 carb,  100 cal     Dannon Light n Fit [place] Yogurt  (80 cal, 8 carbs)  Other so called "protein bars" and Greek yogurts tend to be loaded with carbohydrates.  Remember, in food advertising, the word "energy" is synonymous for " carbohydrate."  Lunch:   A Sandwich using the bread choices listed, Can use any  Eggs,  lunchmeat, grilled meat or canned tuna), avocado, regular mayo/mustard  and cheese.  A Salad using blue cheese, ranch,  Goddess or vinagrette,  No croutons or "confetti" and no "candied nuts" but regular nuts OK.   No pretzels or chips.  Pickles and miniature sweet peppers are a good low carb alternative that provide a "crunch"  The bread is the only source of  carbohydrate in a sandwich and  can be decreased by trying some of these alternatives to traditional loaf bread  Joseph's makes a pita bread and a flat bread that are 50 cal and 4 net carbs available at [place] and [place].  This can be toasted to use with hummous as well  Toufayan makes a low carb flatbread that's 100 cal and 9 net carbs available at [employer] and [employer]J's makes 2 sizes of  Low carb whole wheat tortilla  (The large one is 210 cal and 6 net carbs)  Flat Out makes flatbreads that are low carb as well  Avoid "Low fat dressings, as well as Barry Brunner and [place] dressings They are loaded with sugar!   3 PM/ Mid day  Snack:  Consider  1 ounce of  almonds, walnuts, pistachios, pecans, peanuts,  Macadamia nuts or a nut medley.  Avoid "granola"; the dried cranberries and raisins are loaded with carbohydrates. Mixed nuts as long as there are no raisins,  cranberries or dried fruit.    Try the prosciutto/mozzarella cheese sticks by [employer]  In deli /backery section   High protein   To avoid overindulging in snacks: Try drinking a glass of unsweeted almond/coconut milk  Or a cup of coffee with your Atkins chocolate bar to keep you from having 3!!!   Pork rinds!  Yes Pork Rinds        6 PM  Dinner:     Meat/fowl/fish with a green salad, and either broccoli, cauliflower, green beans, spinach, brussel sprouts or  Lima beans. DO NOT BREAD THE PROTEIN!!      There is a low carb pasta by Dreamfield's that is acceptable and tastes great: only 5 digestible carbs/serving.( All grocery stores but BJs carry it )  Try Hurley Cisco Angelo's chicken piccata or chicken or eggplant parm over low carb pasta.(Lowes and BJs)   Marjory Lies Sanchez's "Carnitas" (pulled pork, no sauce,  0 carbs) or his beef pot roast to make a dinner burrito (at BJ's)  Pesto over low carb pasta (bj's sells a good quality pesto in the center refrigerated section of the deli   Try satueeing  Cheral Marker with mushroooms  Whole  wheat pasta is still full of digestible carbs and  Not as low in glycemic index as Dreamfield's.   Brown rice is still rice,  So skip the rice and noodles if you eat [place] or [place] (or at least limit to 1/2 cup)  9 PM snack :   Breyer's "low carb" fudgsicle or  ice cream bar (Carb Smart line), or  Weight Watcher's ice cream bar , or another "no sugar added" ice cream;  a serving of fresh berries/cherries with whipped cream   Cheese or DANNON'S LlGHT N FIT GREEK YOGURT or the [place] greek yogurt   8 ounces of [place] unsweetened almond/cococunut milk    Avoid bananas, pineapple, grapes  and watermelon on a regular basis because they are high in sugar.  THINK OF THEM AS DESSERT  Remember that snack Substitutions should be less than 10 NET carbs per serving and meals < 20 carbs. Remember to subtract fiber grams to get the "net carbs."  Health Maintenance A healthy lifestyle and preventative care can promote health and wellness.  Maintain regular health, dental, and eye exams.  Eat a healthy diet. Foods like vegetables, fruits, whole grains, low-fat dairy products, and lean protein foods contain the nutrients you need and are low in calories. Decrease your intake of foods high in solid fats, added sugars, and salt. Get information about a proper diet from your health care provider, if necessary.  Regular physical exercise is one of the most important things you can do for your health. Most adults should get at least 150 minutes of moderate-intensity exercise (any activity that increases your heart rate and causes you to sweat) each week. In addition, most adults need muscle-strengthening exercises on 2 or more days a week.   Maintain a healthy weight. The body mass index (BMI) is a screening tool to identify possible weight problems. It provides an estimate of body fat based on height and weight. Your health care provider can find your BMI and can help you achieve or maintain a healthy  weight. For males 20 years and older:  A BMI below 18.5 is considered underweight.  A BMI of 18.5 to 24.9 is normal.  A BMI of 25 to 29.9 is considered overweight.  A BMI of 30 and above is considered obese.  Maintain normal blood lipids and cholesterol by exercising and minimizing your intake of saturated fat. Eat a balanced diet with plenty of fruits and vegetables. Blood tests for lipids and cholesterol should begin at age 25 and be repeated  every 5 years. If your lipid or cholesterol levels are high, you are over age 52, or you are at high risk for heart disease, you may need your cholesterol levels checked more frequently.Ongoing high lipid and cholesterol levels should be treated with medicines if diet and exercise are not working.  If you smoke, find out from your health care provider how to quit. If you do not use tobacco, do not start.  Lung cancer screening is recommended for adults aged 43-80 years who are at high risk for developing lung cancer because of a history of smoking. A yearly low-dose CT scan of the lungs is recommended for people who have at least a 30-pack-year history of smoking and are current smokers or have quit within the past 15 years. A pack year of smoking is smoking an average of 1 pack of cigarettes a day for 1 year (for example, a 30-pack-year history of smoking could mean smoking 1 pack a day for 30 years or 2 packs a day for 15 years). Yearly screening should continue until the smoker has stopped smoking for at least 15 years. Yearly screening should be stopped for people who develop a health problem that would prevent them from having lung cancer treatment.  If you choose to drink alcohol, do not have more than 2 drinks per day. One drink is considered to be 12 oz (360 mL) of beer, 5 oz (150 mL) of wine, or 1.5 oz (45 mL) of liquor.  Avoid the use of street drugs. Do not share needles with anyone. Ask for help if you need support or instructions about stopping  the use of drugs.  High blood pressure causes heart disease and increases the risk of stroke. Blood pressure should be checked at least every 1-2 years. Ongoing high blood pressure should be treated with medicines if weight loss and exercise are not effective.  If you are 78-70 years old, ask your health care provider if you should take aspirin to prevent heart disease.  Diabetes screening involves taking a blood sample to check your fasting blood sugar level. This should be done once every 3 years after age 61 if you are at a normal weight and without risk factors for diabetes. Testing should be considered at a younger age or be carried out more frequently if you are overweight and have at least 1 risk factor for diabetes.  Colorectal cancer can be detected and often prevented. Most routine colorectal cancer screening begins at the age of 85 and continues through age 86. However, your health care provider may recommend screening at an earlier age if you have risk factors for colon cancer. On a yearly basis, your health care provider may provide home test kits to check for hidden blood in the stool. A small camera at the end of a tube may be used to directly examine the colon (sigmoidoscopy or colonoscopy) to detect the earliest forms of colorectal cancer. Talk to your health care provider about this at age 79 when routine screening begins. A direct exam of the colon should be repeated every 5-10 years through age 69, unless early forms of precancerous polyps or small growths are found.  People who are at an increased risk for hepatitis B should be screened for this virus. You are considered at high risk for hepatitis B if:  You were born in a country where hepatitis B occurs often. Talk with your health care provider about which countries are considered high risk.  Your parents  were born in a high-risk country and you have not received a shot to protect against hepatitis B (hepatitis B  vaccine).  You have HIV or AIDS.  You use needles to inject street drugs.  You live with, or have sex with, someone who has hepatitis B.  You are a man who has sex with other men (MSM).  You get hemodialysis treatment.  You take certain medicines for conditions like cancer, organ transplantation, and autoimmune conditions.  Hepatitis C blood testing is recommended for all people born from [birth year] through 1966 and any individual with known risk factors for hepatitis C.  Healthy men should no longer receive prostate-specific antigen (PSA) blood tests as part of routine cancer screening. Talk to your health care provider about prostate cancer screening.  Testicular cancer screening is not recommended for adolescents or adult males who have no symptoms. Screening includes self-exam, a health care provider exam, and other screening tests. Consult with your health care provider about any symptoms you have or any concerns you have about testicular cancer.  Practice safe sex. Use condoms and avoid high-risk sexual practices to reduce the spread of sexually transmitted infections (STIs).  You should be screened for STIs, including gonorrhea and chlamydia if:  You are sexually active and are younger than 24 years.  You are older than 24 years, and your health care provider tells you that you are at risk for this type of infection.  Your sexual activity has changed since you were last screened, and you are at an increased risk for chlamydia or gonorrhea. Ask your health care provider if you are at risk.  If you are at risk of being infected with HIV, it is recommended that you take a prescription medicine daily to prevent HIV infection. This is called pre-exposure prophylaxis (PrEP). You are considered at risk if:  You are a man who has sex with other men (MSM).  You are a heterosexual man who is sexually active with multiple partners.  You take drugs by injection.  You are sexually active  with a partner who has HIV.  Talk with your health care provider about whether you are at high risk of being infected with HIV. If you choose to begin PrEP, you should first be tested for HIV. You should then be tested every 3 months for as long as you are taking PrEP.  Use sunscreen. Apply sunscreen liberally and repeatedly throughout the day. You should seek shade when your shadow is shorter than you. Protect yourself by wearing long sleeves, pants, a wide-brimmed hat, and sunglasses year round whenever you are outdoors.  Tell your health care provider of new moles or changes in moles, especially if there is a change in shape or color. Also, tell your health care provider if a mole is larger than the size of a pencil eraser.  A one-time screening for abdominal aortic aneurysm (AAA) and surgical repair of large AAAs by ultrasound is recommended for men aged 11-75 years who are current or former smokers.  Stay current with your vaccines (immunizations). Document Released: 10/13/2007 Document Revised: 04/21/2013 Document Reviewed: 09/11/2010 [facility] Patient Information 2015 [place], [place]. This information is not intended to replace advice given to you by your health care provider. Make sure you discuss any questions you have with your health care provider.

## 2014-02-06 LAB — HEPATITIS C ANTIBODY: HCV AB: NEGATIVE

## 2014-02-07 DIAGNOSIS — Z79899 Other long term (current) drug therapy: Secondary | ICD-10-CM | POA: Insufficient documentation

## 2014-02-07 NOTE — Assessment & Plan Note (Signed)
on current statin therapy.   Liver enzymes are normal , no changes today.  Lab Results  Component Value Date   ALT 27 02/05/2014   AST 33 02/05/2014   ALKPHOS 71 02/05/2014   BILITOT 0.9 02/05/2014

## 2014-02-07 NOTE — Assessment & Plan Note (Signed)
Annual comprehensive exam was done . All screenings have been addressed and a printed health maintenance schedule was given to patient.

## 2014-02-07 NOTE — Progress Notes (Signed)
Patient ID: Gerald Flores, male   DOB: 1951-04-19, 63 y.o.   MRN: [id number]   The patient is here for his annualphysical examination and management of other chronic and acute problems.   The risk factors are reflected in the social history.  The roster of all physicians providing medical care to patient - is listed in the Snapshot section of the chart.  Home safety : The patient has smoke detectors in the home. He wears seatbelts.  There are  firearms at home. There is no violence in the home.   There is no risks for hepatitis, STDs or HIV. There is no   history of blood transfusion. There is no travel history to infectious disease endemic areas of the world.  The patient has seen their dentist in the last six month and  their eye doctor in the last year.  They do not  have excessive sun exposure. They have seen a dermatoloigist in the last year. Discussed the need for sun protection: hats, long sleeves and use of sunscreen if there is significant sun exposure.   Diet: the importance of a healthy diet is discussed. They do have a healthy diet but has gained weight .   The benefits of regular aerobic exercise were discussed. He exercises a minimum of 30 minutes  5 days per week. Depression screen: there are no signs or vegative symptoms of depression- irritability, change in appetite, anhedonia, sadness/tearfullness.  The following portions of the patient's history were reviewed and updated as appropriate: allergies, current medications, past family history, past medical history,  past surgical history, past social history  and problem list.  Visual acuity was not assessed per patient preference since he has regular follow up with his ophthalmologist. Hearing and body mass index were assessed and reviewed.   During the course of the visit the patient was educated and counseled about appropriate screening and preventive services including :  nutrition counseling, colorectal cancer screening, and  recommended immunizations.    Objective:  BP 126/76  Pulse 86  Temp(Src) 97.7 F (36.5 C) (Oral)  Resp 16  Ht 6' (1.829 m)  Wt 219 lb 4 oz (99.451 kg)  BMI 29.73 kg/m2  SpO2 97%  General Appearance:    Alert, cooperative, no distress, appears stated age  Head:    Normocephalic, without obvious abnormality, atraumatic  Eyes:    PERRL, conjunctiva/corneas clear, EOM's intact, fundi    benign, both eyes       Ears:    Normal TM's and external ear canals, both ears  Nose:   Nares normal, septum midline, mucosa normal, no drainage   or sinus tenderness  Throat:   Lips, mucosa, and tongue normal; teeth and gums normal  Neck:   Supple, symmetrical, trachea midline, no adenopathy;       thyroid:  No enlargement/tenderness/nodules; no carotid   bruit or JVD  Back:     Symmetric, no curvature, ROM normal, no CVA tenderness  Lungs:     Clear to auscultation bilaterally, respirations unlabored  Chest wall:    No tenderness or deformity  Heart:    Regular rate and rhythm, S1 and S2 normal, no murmur, rub   or gallop  Abdomen:     Soft, non-tender, bowel sounds active all four quadrants,    no masses, no organomegaly        Extremities:   Extremities normal, atraumatic, no cyanosis or edema  Pulses:   2+ and symmetric all extremities  Skin:  Skin color, texture, turgor normal, no rashes or lesions  Lymph nodes:   Cervical, supraclavicular, and axillary nodes normal  Neurologic:   CNII-XII intact. Normal strength, sensation and reflexes      throughout   Assessment and Plan:   Overweight (BMI 25.0-29.9) Weight gain of 12 lbs in one year addressed.  I have addressed  BMI and recommended wt loss of 10% of body weigh over the next 6 months using a low glycemic index diet and regular exercise a minimum of 5 days per week.    Transitional cell carcinoma No recurrence thus far, His annual prostate end testicular exam is done by Dr Yves Dill during annual followup.   Hypertriglyceridemia He  did not tolerate daily Crestor due to myalgias.  Repeat lipids are improved and trigs are < 200 ,  Ldl 170.  He will try adding coQ 10 and taking the Crestor every other day Lab Results  Component Value Date   CHOL 253* 06/29/2013   HDL 50 08/31/2013   LDLCALC 96 08/31/2013   LDLDIRECT 136.4 12/09/2012   TRIG 147 08/31/2013   CHOLHDL 6 06/29/2013      Encounter for long-term (current) use of high-risk medication on current statin therapy.   Liver enzymes are normal , no changes today.  Lab Results  Component Value Date   ALT 27 02/05/2014   AST 33 02/05/2014   ALKPHOS 71 02/05/2014   BILITOT 0.9 02/05/2014     Visit for preventive health examination Annual comprehensive exam was done . All screenings have been addressed and a printed health maintenance schedule was given to patient.    Hypertension Well controlled on current regimen. Renal function stable, no changes today.  Lab Results  Component Value Date   CREATININE 0.9 02/05/2014   Lab Results  Component Value Date   NA 138 02/05/2014   K 4.2 02/05/2014   CL 104 02/05/2014   CO2 28 02/05/2014      Updated Medication List Outpatient Encounter Prescriptions as of 02/05/2014  Medication Sig  . amLODipine (NORVASC) 10 MG tablet TAKE 1 TABLET (10 MG TOTAL) BY MOUTH DAILY.  Marland Kitchen co-enzyme Q-10 30 MG capsule Take 1 capsule (30 mg total) by mouth daily.  . colchicine 0.6 MG tablet Take 1 tablet (0.6 mg total) by mouth 2 (two) times daily. For 7 days, as needed  for gout flare  . Multiple Vitamin (MULTIVITAMIN) tablet Take 1 tablet by mouth daily.  . Omega-3 Fatty Acids (FISH OIL) 1000 MG CAPS Take by mouth as directed  . rosuvastatin (CRESTOR) 20 MG tablet Take 1 tablet (20 mg total) by mouth at bedtime.  . tadalafil (CIALIS) 10 MG tablet Take 10 mg by mouth daily as needed.  . vitamin E 400 UNIT capsule Take 400 Units by mouth daily.

## 2014-02-07 NOTE — Assessment & Plan Note (Signed)
Well controlled on current regimen. Renal function stable, no changes today.  Lab Results  Component Value Date   CREATININE 0.9 02/05/2014   Lab Results  Component Value Date   NA 138 02/05/2014   K 4.2 02/05/2014   CL 104 02/05/2014   CO2 28 02/05/2014

## 2014-02-07 NOTE — Assessment & Plan Note (Signed)
No recurrence thus far, His annual prostate end testicular exam is done by Dr Yves Dill during annual followup.

## 2014-02-07 NOTE — Assessment & Plan Note (Signed)
Weight gain of 12 lbs in one year addressed.  I have addressed  BMI and recommended wt loss of 10% of body weigh over the next 6 months using a low glycemic index diet and regular exercise a minimum of 5 days per week.

## 2014-02-07 NOTE — Assessment & Plan Note (Signed)
He did not tolerate daily Crestor due to myalgias.  Repeat lipids are improved and trigs are < 200 ,  Ldl 170.  He will try adding coQ 10 and taking the Crestor every other day Lab Results  Component Value Date   CHOL 253* 06/29/2013   HDL 50 08/31/2013   LDLCALC 96 08/31/2013   LDLDIRECT 136.4 12/09/2012   TRIG 147 08/31/2013   CHOLHDL 6 06/29/2013

## 2014-02-08 LAB — LIPID PANEL
CHOLESTEROL: 230 mg/dL — AB (ref 0–200)
HDL: 41.8 mg/dL (ref >39.00)
NonHDL: 188.2
Total CHOL/HDL Ratio: 6
Triglycerides: 299 mg/dL — ABNORMAL HIGH (ref 0.0–149.0)
VLDL: 59.8 mg/dL — ABNORMAL HIGH (ref 0.0–40.0)

## 2014-02-08 LAB — LDL CHOLESTEROL, DIRECT: Direct LDL: 130.3 mg/dL

## 2014-02-08 LAB — TSH: TSH: 1.44 u[IU]/mL (ref 0.35–4.50)

## 2014-02-09 ENCOUNTER — Encounter: Payer: Self-pay | Admitting: Internal Medicine

## 2014-03-01 ENCOUNTER — Telehealth: Payer: Self-pay | Admitting: Internal Medicine

## 2014-03-01 DIAGNOSIS — E781 Pure hyperglyceridemia: Secondary | ICD-10-CM

## 2014-03-01 MED ORDER — ROSUVASTATIN CALCIUM 20 MG PO TABS
20.0000 mg | ORAL_TABLET | Freq: Every day | ORAL | Status: DC
Start: 2014-03-01 — End: 2014-11-09

## 2014-03-01 MED ORDER — AMLODIPINE BESYLATE 10 MG PO TABS: ORAL_TABLET | ORAL | Status: DC

## 2014-03-01 NOTE — Telephone Encounter (Signed)
Refill left on voicemail to send amlodipine and crestor to [facility].

## 2014-11-09 ENCOUNTER — Telehealth: Payer: Self-pay | Admitting: *Deleted

## 2014-11-09 DIAGNOSIS — E781 Pure hyperglyceridemia: Secondary | ICD-10-CM

## 2014-11-09 MED ORDER — ROSUVASTATIN CALCIUM 20 MG PO TABS: 20.0000 mg | ORAL_TABLET | Freq: Every day | ORAL | Status: DC

## 2014-11-09 NOTE — Telephone Encounter (Signed)
crestor rx changed to 30 days and sent

## 2014-11-09 NOTE — Telephone Encounter (Signed)
Called [phone] to start Prior Authorization on the Crestor, form is beng faxed over now

## 2014-11-09 NOTE — Telephone Encounter (Signed)
Raquel Sarna called starting that it would be approved if the quantity was changed to 30 instead of 90

## 2015-09-22 ENCOUNTER — Other Ambulatory Visit: Payer: Self-pay | Admitting: Internal Medicine

## 2015-09-22 NOTE — Telephone Encounter (Signed)
DENIED,.  CANNOT REFILL WITHOUT FASTING LABS AND OV

## 2015-09-22 NOTE — Telephone Encounter (Signed)
Last OV 10/15 also last labs?

## 2015-09-27 ENCOUNTER — Encounter: Payer: Self-pay | Admitting: Internal Medicine

## 2015-09-27 ENCOUNTER — Ambulatory Visit (INDEPENDENT_AMBULATORY_CARE_PROVIDER_SITE_OTHER): Payer: Managed Care, Other (non HMO) | Admitting: Internal Medicine

## 2015-09-27 VITALS — BP 142/90 | HR 68 | Temp 98.1°F | Resp 12 | Ht 72.0 in | Wt 215.8 lb

## 2015-09-27 DIAGNOSIS — Z8639 Personal history of other endocrine, nutritional and metabolic disease: Secondary | ICD-10-CM

## 2015-09-27 DIAGNOSIS — E781 Pure hyperglyceridemia: Secondary | ICD-10-CM

## 2015-09-27 DIAGNOSIS — I10 Essential (primary) hypertension: Secondary | ICD-10-CM | POA: Diagnosis not present

## 2015-09-27 DIAGNOSIS — Z0000 Encounter for general adult medical examination without abnormal findings: Secondary | ICD-10-CM | POA: Diagnosis not present

## 2015-09-27 DIAGNOSIS — E663 Overweight: Secondary | ICD-10-CM

## 2015-09-27 DIAGNOSIS — E559 Vitamin D deficiency, unspecified: Secondary | ICD-10-CM

## 2015-09-27 DIAGNOSIS — M1009 Idiopathic gout, multiple sites: Secondary | ICD-10-CM

## 2015-09-27 DIAGNOSIS — R5383 Other fatigue: Secondary | ICD-10-CM

## 2015-09-27 DIAGNOSIS — Z23 Encounter for immunization: Secondary | ICD-10-CM

## 2015-09-27 DIAGNOSIS — E785 Hyperlipidemia, unspecified: Secondary | ICD-10-CM

## 2015-09-27 DIAGNOSIS — Z125 Encounter for screening for malignant neoplasm of prostate: Secondary | ICD-10-CM

## 2015-09-27 DIAGNOSIS — M109 Gout, unspecified: Secondary | ICD-10-CM

## 2015-09-27 DIAGNOSIS — Z8739 Personal history of other diseases of the musculoskeletal system and connective tissue: Secondary | ICD-10-CM

## 2015-09-27 LAB — CBC WITH DIFFERENTIAL/PLATELET
BASOS ABS: 0 10*3/uL (ref 0.0–0.1)
Basophils Relative: 0.8 % (ref 0.0–3.0)
EOS ABS: 0.2 10*3/uL (ref 0.0–0.7)
Eosinophils Relative: 2.8 % (ref 0.0–5.0)
HCT: 42.5 % (ref 39.0–52.0)
Hemoglobin: 14.4 g/dL (ref 13.0–17.0)
LYMPHS ABS: 1.9 10*3/uL (ref 0.7–4.0)
LYMPHS PCT: 30.9 % (ref 12.0–46.0)
MCHC: 34 g/dL (ref 30.0–36.0)
MCV: 87.7 fl (ref 78.0–100.0)
MONOS PCT: 9.1 % (ref 3.0–12.0)
Monocytes Absolute: 0.6 10*3/uL (ref 0.1–1.0)
NEUTROS PCT: 56.4 % (ref 43.0–77.0)
Neutro Abs: 3.5 10*3/uL (ref 1.4–7.7)
Platelets: 260 10*3/uL (ref 150.0–400.0)
RBC: 4.84 Mil/uL (ref 4.22–5.81)
RDW: 13.1 % (ref 11.5–15.5)
WBC: 6.2 10*3/uL (ref 4.0–10.5)

## 2015-09-27 LAB — LIPID PANEL
Cholesterol: 154 mg/dL (ref 0–200)
HDL: 47.4 mg/dL (ref >39.00)
LDL Cholesterol: 72 mg/dL (ref 0–99)
NONHDL: 106.59
TRIGLYCERIDES: 171 mg/dL — AB (ref 0.0–149.0)
Total CHOL/HDL Ratio: 3
VLDL: 34.2 mg/dL (ref 0.0–40.0)

## 2015-09-27 LAB — COMPREHENSIVE METABOLIC PANEL
ALBUMIN: 4.5 g/dL (ref 3.5–5.2)
ALT: 28 U/L (ref 0–53)
AST: 34 U/L (ref 0–37)
Alkaline Phosphatase: 58 U/L (ref 39–117)
BILIRUBIN TOTAL: 0.8 mg/dL (ref 0.2–1.2)
BUN: 14 mg/dL (ref 6–23)
CO2: 29 mEq/L (ref 19–32)
CREATININE: 0.89 mg/dL (ref 0.40–1.50)
Calcium: 9.4 mg/dL (ref 8.4–10.5)
Chloride: 104 mEq/L (ref 96–112)
GFR: 90.8 mL/min (ref >60.00)
Glucose, Bld: 99 mg/dL (ref 70–99)
Potassium: 4.6 mEq/L (ref 3.5–5.1)
Sodium: 138 mEq/L (ref 135–145)
Total Protein: 6.9 g/dL (ref 6.0–8.3)

## 2015-09-27 LAB — VITAMIN D 25 HYDROXY (VIT D DEFICIENCY, FRACTURES): VITD: 26.23 ng/mL — AB (ref 30.00–100.00)

## 2015-09-27 LAB — PSA: PSA: 1.85 ng/mL (ref 0.10–4.00)

## 2015-09-27 LAB — TSH: TSH: 1.85 u[IU]/mL (ref 0.35–4.50)

## 2015-09-27 LAB — URIC ACID: URIC ACID, SERUM: 8 mg/dL — AB (ref 4.0–7.8)

## 2015-09-27 MED ORDER — AMLODIPINE BESYLATE 10 MG PO TABS: ORAL_TABLET | ORAL | Status: DC

## 2015-09-27 MED ORDER — COLCHICINE 0.6 MG PO TABS: 0.6000 mg | ORAL_TABLET | Freq: Two times a day (BID) | ORAL | Status: AC

## 2015-09-27 MED ORDER — COENZYME Q10 30 MG PO CAPS: 30.0000 mg | ORAL_CAPSULE | Freq: Every day | ORAL | Status: DC

## 2015-09-27 MED ORDER — ROSUVASTATIN CALCIUM 20 MG PO TABS: 20.0000 mg | ORAL_TABLET | Freq: Every day | ORAL | Status: DC

## 2015-09-27 NOTE — Progress Notes (Signed)
Patient ID: Gerald Flores, male    DOB: 1950-08-20  Age: 65 y.o. MRN: [id number]  The patient is here for annual Medicare wellness examination and management of other chronic and acute problems.   Last colonoscopy was done in  2008 by  [place]  Not exercising .  X pipe smoker quit in 2008  The risk factors are reflected in the social history.  The roster of all physicians providing medical care to patient - is listed in the Snapshot section of the chart.  Activities of daily living:  The patient is 100% independent in all ADLs: dressing, toileting, feeding as well as independent mobility  Home safety : The patient has smoke detectors in the home. They wear seatbelts.  There are no firearms at home. There is no violence in the home.   There is no risks for hepatitis, STDs or HIV. There is no   history of blood transfusion. They have no travel history to infectious disease endemic areas of the world.  The patient has seen their dentist in the last six month. They have seen their eye doctor in the last year. They admit to slight hearing difficulty with regard to whispered voices and some television programs.  They have deferred audiologic testing in the last year.  They do not  have excessive sun exposure. Discussed the need for sun protection: hats, long sleeves and use of sunscreen if there is significant sun exposure.   Diet: the importance of a healthy diet is discussed. They do have a healthy diet.  The benefits of regular aerobic exercise were discussed.   Depression screen: there are no signs or vegative symptoms of depression- irritability, change in appetite, anhedonia, sadness/tearfullness.  The following portions of the patient's history were reviewed and updated as appropriate: allergies, current medications, past family history, past medical history,  past surgical history, past social history  and problem list.  Visual acuity was not assessed per patient preference  since she has regular follow up with her ophthalmologist. Hearing and body mass index were assessed and reviewed.   During the course of the visit the patient was educated and counseled about appropriate screening and preventive services including : fall prevention , diabetes screening, nutrition counseling, colorectal cancer screening, and recommended immunizations.    CC: The primary encounter diagnosis was Essential hypertension. Diagnoses of Screening for prostate cancer, Hyperlipidemia, Other fatigue, Vitamin D deficiency, History of gout, Need for prophylactic vaccination against Streptococcus pneumoniae (pneumococcus), Gouty arthritis, Visit for preventive health examination, Overweight (BMI 25.0-29.9), and Hypertriglyceridemia were also pertinent to this visit.   follow up on hypertension and gout.  He was last seen Oct 2015.  He has no acute issues,  Other than grief.  His mother Gerald Flores died last month    Had an attack of gout 3 weeks ago, used colchicine for 8 days with good results .  Only one attack per year Needs meds refilled    History Gerald Flores has a past medical history of Special screening for malignant neoplasm of prostate; Special screening for malignant neoplasms, colon; Gouty arthritis; Hypertension; Hypertriglyceridemia; and Transitional cell carcinoma ([place]) (1994).   He has no past surgical history on file.   His family history includes Hypertension in his father and mother; Kidney disease in his mother; Schizophrenia in his brother.He reports that he has quit smoking. His smoking use included Pipe and Cigars. He does not have any smokeless tobacco history on file. He reports that he does not  drink alcohol. His drug history is not on file.  Outpatient Prescriptions Prior to Visit  Medication Sig Dispense Refill  . amLODipine (NORVASC) 10 MG tablet TAKE 1 TABLET (10 MG TOTAL) BY MOUTH DAILY. 90 tablet 3  . colchicine 0.6 MG tablet Take 1 tablet (0.6 mg total) by mouth 2  (two) times daily. For 7 days, as needed  for gout flare 60 tablet 1  . Multiple Vitamin (MULTIVITAMIN) tablet Take 1 tablet by mouth daily.    . Omega-3 Fatty Acids (FISH OIL) 1000 MG CAPS Take by mouth as directed    . rosuvastatin (CRESTOR) 20 MG tablet Take 1 tablet (20 mg total) by mouth at bedtime. 30 tablet 5  . tadalafil (CIALIS) 10 MG tablet Take 10 mg by mouth daily as needed.    . vitamin E 400 UNIT capsule Take 400 Units by mouth daily.    Marland Kitchen co-enzyme Q-10 30 MG capsule Take 1 capsule (30 mg total) by mouth daily. (Patient not taking: Reported on 09/27/2015) 90 capsule 3   No facility-administered medications prior to visit.    Review of Systems   Patient denies headache, fevers, malaise, unintentional weight loss, skin rash, eye pain, sinus congestion and sinus pain, sore throat, dysphagia,  hemoptysis , cough, dyspnea, wheezing, chest pain, palpitations, orthopnea, edema, abdominal pain, nausea, melena, diarrhea, constipation, flank pain, dysuria, hematuria, urinary  Frequency, nocturia, numbness, tingling, seizures,  Focal weakness, Loss of consciousness,  Tremor, insomnia, depression, anxiety, and suicidal ideation.      Objective:  BP 142/90 mmHg  Pulse 68  Temp(Src) 98.1 F (36.7 C) (Oral)  Resp 12  Ht 6' (1.829 m)  Wt 215 lb 12 oz (97.864 kg)  BMI 29.25 kg/m2  SpO2 97%  Physical Exam   General appearance: alert, cooperative and appears stated age Ears: normal TM's and external ear canals both ears Throat: lips, mucosa, and tongue normal; teeth and gums normal Neck: no adenopathy, no carotid bruit, supple, symmetrical, trachea midline and thyroid not enlarged, symmetric, no tenderness/mass/nodules Back: symmetric, no curvature. ROM normal. No CVA tenderness. Lungs: clear to auscultation bilaterally Heart: regular rate and rhythm, S1, S2 normal, no murmur, click, rub or gallop Abdomen: soft, non-tender; bowel sounds normal; no masses,  no organomegaly Pulses: 2+  and symmetric Skin: Skin color, texture, turgor normal. No rashes or lesions Lymph nodes: Cervical, supraclavicular, and axillary nodes normal.    Assessment & Plan:   Problem List Items Addressed This Visit    Gouty arthritis    Occurring less than once a year. Most recent episode occurred after his mother's funeral, likely due to dietary indiscretions from church food.       Hypertension - Primary    Mildly elevated today. Have asked patient to check BP at home and report readings.       Relevant Orders   Comprehensive metabolic panel (Completed)   Lipid panel (Completed)   Hypertriglyceridemia    He did not tolerate daily Crestor due to myalgias, but is tolerating every other day dosing. .  Repeat lipids are improved and trigs are < 200 ,  Ldl 130.  No changes today  Lab Results  Component Value Date   CHOL 154 09/27/2015   HDL 47.40 09/27/2015   LDLCALC 72 09/27/2015   LDLDIRECT 130.3 02/05/2014   TRIG 171.0* 09/27/2015   CHOLHDL 3 09/27/2015            Visit for preventive health examination    Annual comprehensive preventive exam  was done as well as an evaluation and management of acute and chronic conditions .  During the course of the visit the patient was educated and counseled about appropriate screening and preventive services including :  diabetes screening, lipid analysis with projected  10 year  risk for CAD , nutrition counseling, prostate and colorectal cancer screening, and recommended immunizations.  Printed recommendations for health maintenance screenings was given.       Overweight (BMI 25.0-29.9)    I have addressed  BMI and recommended wt loss of 10% of body weight over the next 6 months using a low fat, low starch, high protein  fruit/vegetable based Mediterranean diet and 30 minutes of aerobic exercise a minimum of 5 days per week.        Screening for prostate cancer   Relevant Orders   PSA (Completed)    Other Visit Diagnoses     Hyperlipidemia        Relevant Orders    Lipid panel (Completed)    Other fatigue        Relevant Orders    TSH (Completed)    CBC with Differential/Platelet (Completed)    Vitamin D deficiency        Relevant Orders    VITAMIN D 25 Hydroxy (Vit-D Deficiency, Fractures) (Completed)    History of gout        Relevant Orders    Uric acid (Completed)    Need for prophylactic vaccination against Streptococcus pneumoniae (pneumococcus)        Relevant Orders    Pneumococcal conjugate vaccine 13-valent (Completed)       I am having Mr. Bardin maintain his tadalafil, vitamin E, multivitamin, Fish Oil, co-enzyme Q-10, colchicine, amLODipine, and rosuvastatin.  No orders of the defined types were placed in this encounter.    There are no discontinued medications.  Follow-up: Return in about 6 months (around 03/29/2016).   Crecencio Mc, MD

## 2015-09-27 NOTE — Patient Instructions (Signed)

## 2015-09-27 NOTE — Assessment & Plan Note (Signed)
I have addressed  BMI and recommended wt loss of 10% of body weight over the next 6 months using a low fat, low starch, high protein  fruit/vegetable based Mediterranean diet and 30 minutes of aerobic exercise a minimum of 5 days per week.   

## 2015-09-27 NOTE — Assessment & Plan Note (Signed)

## 2015-09-27 NOTE — Assessment & Plan Note (Signed)
He did not tolerate daily Crestor due to myalgias, but is tolerating every other day dosing. .  Repeat lipids are improved and trigs are < 200 ,  Ldl 130.  No changes today  Lab Results  Component Value Date   CHOL 154 09/27/2015   HDL 47.40 09/27/2015   LDLCALC 72 09/27/2015   LDLDIRECT 130.3 02/05/2014   TRIG 171.0* 09/27/2015   CHOLHDL 3 09/27/2015

## 2015-09-27 NOTE — Assessment & Plan Note (Signed)
Occurring less than once a year. Most recent episode occurred after his mother's funeral, likely due to dietary indiscretions from church food.

## 2015-09-27 NOTE — Assessment & Plan Note (Signed)
Mildly elevated today. Have asked patient to check BP at home and report readings.

## 2015-09-27 NOTE — Progress Notes (Signed)
Pre-visit discussion using our clinic review tool. No additional management support is needed unless otherwise documented below in the visit note.  

## 2015-09-29 ENCOUNTER — Encounter: Payer: Self-pay | Admitting: Internal Medicine

## 2016-03-30 ENCOUNTER — Encounter: Payer: Self-pay | Admitting: Internal Medicine

## 2016-03-30 ENCOUNTER — Ambulatory Visit (INDEPENDENT_AMBULATORY_CARE_PROVIDER_SITE_OTHER): Payer: Managed Care, Other (non HMO) | Admitting: Internal Medicine

## 2016-03-30 VITALS — BP 146/92 | HR 73 | Temp 97.6°F | Resp 12 | Ht 72.0 in | Wt 219.2 lb

## 2016-03-30 DIAGNOSIS — I10 Essential (primary) hypertension: Secondary | ICD-10-CM | POA: Diagnosis not present

## 2016-03-30 DIAGNOSIS — E785 Hyperlipidemia, unspecified: Secondary | ICD-10-CM

## 2016-03-30 DIAGNOSIS — E781 Pure hyperglyceridemia: Secondary | ICD-10-CM | POA: Diagnosis not present

## 2016-03-30 DIAGNOSIS — M109 Gout, unspecified: Secondary | ICD-10-CM

## 2016-03-30 DIAGNOSIS — E663 Overweight: Secondary | ICD-10-CM

## 2016-03-30 LAB — LIPID PANEL
Cholesterol: 182 mg/dL (ref 0–200)
HDL: 48.6 mg/dL (ref >39.00)
LDL Cholesterol: 96 mg/dL (ref 0–99)
NONHDL: 133.61
Total CHOL/HDL Ratio: 4
Triglycerides: 189 mg/dL — ABNORMAL HIGH (ref 0.0–149.0)
VLDL: 37.8 mg/dL (ref 0.0–40.0)

## 2016-03-30 LAB — COMPREHENSIVE METABOLIC PANEL
ALBUMIN: 4.4 g/dL (ref 3.5–5.2)
ALK PHOS: 58 U/L (ref 39–117)
ALT: 26 U/L (ref 0–53)
AST: 31 U/L (ref 0–37)
BUN: 14 mg/dL (ref 6–23)
CALCIUM: 9.3 mg/dL (ref 8.4–10.5)
CHLORIDE: 104 meq/L (ref 96–112)
CO2: 29 meq/L (ref 19–32)
CREATININE: 0.86 mg/dL (ref 0.40–1.50)
GFR: 94.32 mL/min (ref >60.00)
Glucose, Bld: 96 mg/dL (ref 70–99)
Potassium: 4.4 mEq/L (ref 3.5–5.1)
Sodium: 140 mEq/L (ref 135–145)
Total Bilirubin: 0.7 mg/dL (ref 0.2–1.2)
Total Protein: 7 g/dL (ref 6.0–8.3)

## 2016-03-30 LAB — MICROALBUMIN / CREATININE URINE RATIO
Creatinine,U: 76.1 mg/dL
Microalb Creat Ratio: 1.6 mg/g (ref 0.0–30.0)
Microalb, Ur: 1.2 mg/dL (ref 0.0–1.9)

## 2016-03-30 NOTE — Progress Notes (Signed)
Subjective:  Patient ID: Gerald Flores, male    DOB: 1951/02/07  Age: 65 y.o. MRN: [id number]  CC: The primary encounter diagnosis was Essential hypertension, benign. Diagnoses of Hyperlipidemia LDL goal <100, Essential hypertension, Hypertriglyceridemia, Gouty arthritis, and Overweight (BMI 25.0-29.9) were also pertinent to this visit.  HPI Gerald Flores presents for follow up on hypertension and hyperlipidemia.  Patient is taking his medications as prescribed and notes no adverse effects.  Home BP readings have been done about once per month and are generally > 130/80 but < 170/90 .  he is avoiding added salt in his diet and walking regularly about 3 times per week for exercise  .  BP has been elevated on current regimen   Taking 1000 IUs  D3   Outpatient Medications Prior to Visit  Medication Sig Dispense Refill  . amLODipine (NORVASC) 10 MG tablet TAKE 1 TABLET (10 MG TOTAL) BY MOUTH DAILY. 90 tablet 1  . colchicine 0.6 MG tablet Take 1 tablet (0.6 mg total) by mouth 2 (two) times daily. For 7 days, as needed  for gout flare 60 tablet 1  . Multiple Vitamin (MULTIVITAMIN) tablet Take 1 tablet by mouth daily.    . Omega-3 Fatty Acids (FISH OIL) 1000 MG CAPS Take by mouth as directed    . rosuvastatin (CRESTOR) 20 MG tablet Take 1 tablet (20 mg total) by mouth at bedtime. 90 tablet 1  . tadalafil (CIALIS) 10 MG tablet Take 10 mg by mouth daily as needed.    . vitamin E 400 UNIT capsule Take 400 Units by mouth daily.    Marland Kitchen co-enzyme Q-10 30 MG capsule Take 1 capsule (30 mg total) by mouth daily. (Patient not taking: Reported on 03/30/2016) 90 capsule 1   No facility-administered medications prior to visit.     Review of Systems;  Patient denies headache, fevers, malaise, unintentional weight loss, skin rash, eye pain, sinus congestion and sinus pain, sore throat, dysphagia,  hemoptysis , cough, dyspnea, wheezing, chest pain, palpitations, orthopnea, edema, abdominal pain, nausea, melena,  diarrhea, constipation, flank pain, dysuria, hematuria, urinary  Frequency, nocturia, numbness, tingling, seizures,  Focal weakness, Loss of consciousness,  Tremor, insomnia, depression, anxiety, and suicidal ideation.      Objective:  BP (!) 146/92   Pulse 73   Temp 97.6 F (36.4 C) (Oral)   Resp 12   Ht 6' (1.829 m)   Wt 219 lb 4 oz (99.5 kg)   SpO2 96%   BMI 29.74 kg/m   BP Readings from Last 3 Encounters:  03/30/16 (!) 146/92  09/27/15 (!) 142/90  02/05/14 126/76    Wt Readings from Last 3 Encounters:  03/30/16 219 lb 4 oz (99.5 kg)  09/27/15 215 lb 12 oz (97.9 kg)  02/05/14 219 lb 4 oz (99.5 kg)    General appearance: alert, cooperative and appears stated age Neck: no adenopathy, no carotid bruit, supple, symmetrical, trachea midline and thyroid not enlarged, symmetric, no tenderness/mass/nodules Back: symmetric, no curvature. ROM normal. No CVA tenderness. Lungs: clear to auscultation bilaterally Heart: regular rate and rhythm, S1, S2 normal, no murmur, click, rub or gallop Abdomen: soft, non-tender; bowel sounds normal; no masses,  no organomegaly Pulses: 2+ and symmetric. No edema   No results found for: HGBA1C  Lab Results  Component Value Date   CREATININE 0.86 03/30/2016   CREATININE 0.89 09/27/2015   CREATININE 0.9 02/05/2014    Lab Results  Component Value Date   WBC 6.2 09/27/2015  HGB 14.4 09/27/2015   HCT 42.5 09/27/2015   PLT 260.0 09/27/2015   GLUCOSE 96 03/30/2016   CHOL 182 03/30/2016   TRIG 189.0 (H) 03/30/2016   HDL 48.60 03/30/2016   LDLDIRECT 130.3 02/05/2014   LDLCALC 96 03/30/2016   ALT 26 03/30/2016   AST 31 03/30/2016   NA 140 03/30/2016   K 4.4 03/30/2016   CL 104 03/30/2016   CREATININE 0.86 03/30/2016   BUN 14 03/30/2016   CO2 29 03/30/2016   TSH 1.85 09/27/2015   PSA 1.85 09/27/2015   MICROALBUR 1.2 03/30/2016    Assessment & Plan:   Problem List Items Addressed This Visit    Gouty arthritis    No recent  attacks.  Uric acid level is elevated but given the infrequent episode, he does not want to take a daily medication       Hypertension    Elevated on amlodipine alone.  Normal renal function,  Baseline pulse too low to add beta blocker.  No edema on exam. Adding losartan 25 mg daily,  rtc one week for bmet  Lab Results  Component Value Date   CREATININE 0.86 03/30/2016   Lab Results  Component Value Date   NA 140 03/30/2016   K 4.4 03/30/2016   CL 104 03/30/2016   CO2 29 03/30/2016   Lab Results  Component Value Date   MICROALBUR 1.2 03/30/2016         Hypertriglyceridemia    Mild, He did not tolerate daily Crestor due to myalgias, but is tolerating every other day dosing. .  Repeat lipids are improved and trigs are < 200 ,  Ldl 130.  No changes today  Lab Results  Component Value Date   CHOL 182 03/30/2016   HDL 48.60 03/30/2016   LDLCALC 96 03/30/2016   LDLDIRECT 130.3 02/05/2014   TRIG 189.0 (H) 03/30/2016   CHOLHDL 4 03/30/2016   Lab Results  Component Value Date   ALT 26 03/30/2016   AST 31 03/30/2016   ALKPHOS 58 03/30/2016   BILITOT 0.7 03/30/2016           Overweight (BMI 25.0-29.9)    I have addressed  BMI and recommended a low glycemic index diet utilizing smaller more frequent meals to increase metabolism.  I have also recommended that patient start exercising with a goal of 30 minutes of aerobic exercise a minimum of 5 days per week.         Other Visit Diagnoses    Essential hypertension, benign    -  Primary   Relevant Orders   Comprehensive metabolic panel (Completed)   Microalbumin / creatinine urine ratio (Completed)   Basic metabolic panel   Hyperlipidemia LDL goal <100       Relevant Orders   Lipid panel (Completed)      I am having Gerald Flores maintain his tadalafil, vitamin E, multivitamin, Fish Oil, amLODipine, rosuvastatin, co-enzyme Q-10, colchicine, and Vitamin D3.  Meds ordered this encounter  Medications  . Cholecalciferol  (VITAMIN D3) 1000 units CAPS    Sig: Take 1 capsule by mouth daily.   A total of 25 minutes of face to face time was spent with patient more than half of which was spent in counselling about the above mentioned conditions  and coordination of care  There are no discontinued medications.  Follow-up: Return LABS ONE WEEK .  OV 6 MONTHS .   Crecencio Mc, MD

## 2016-03-30 NOTE — Progress Notes (Signed)
Pre-visit discussion using our clinic review tool. No additional management support is needed unless otherwise documented below in the visit note.  

## 2016-03-30 NOTE — Patient Instructions (Signed)
You received the flu vaccine today  Your blood pressure is elevated based on new recommendations for 120/70 as goal  I am adding losartan 25 mg daily to your current regimen of amlodipine  Please RTC in one week for a repeat BMET

## 2016-04-01 NOTE — Assessment & Plan Note (Addendum)
No recent attacks.  Uric acid level is elevated but given the infrequent episode, he does not want to take a daily medication

## 2016-04-01 NOTE — Assessment & Plan Note (Signed)
Mild, He did not tolerate daily Crestor due to myalgias, but is tolerating every other day dosing. .  Repeat lipids are improved and trigs are < 200 ,  Ldl 130.  No changes today  Lab Results  Component Value Date   CHOL 182 03/30/2016   HDL 48.60 03/30/2016   LDLCALC 96 03/30/2016   LDLDIRECT 130.3 02/05/2014   TRIG 189.0 (H) 03/30/2016   CHOLHDL 4 03/30/2016   Lab Results  Component Value Date   ALT 26 03/30/2016   AST 31 03/30/2016   ALKPHOS 58 03/30/2016   BILITOT 0.7 03/30/2016

## 2016-04-01 NOTE — Assessment & Plan Note (Signed)
Elevated on amlodipine alone.  Normal renal function,  Baseline pulse too low to add beta blocker.  No edema on exam. Adding losartan 25 mg daily,  rtc one week for bmet  Lab Results  Component Value Date   CREATININE 0.86 03/30/2016   Lab Results  Component Value Date   NA 140 03/30/2016   K 4.4 03/30/2016   CL 104 03/30/2016   CO2 29 03/30/2016   Lab Results  Component Value Date   MICROALBUR 1.2 03/30/2016

## 2016-04-01 NOTE — Assessment & Plan Note (Signed)
I have addressed  BMI and recommended a low glycemic index diet utilizing smaller more frequent meals to increase metabolism.  I have also recommended that patient start exercising with a goal of 30 minutes of aerobic exercise a minimum of 5 days per week.  

## 2016-04-02 ENCOUNTER — Telehealth: Payer: Self-pay | Admitting: Internal Medicine

## 2016-04-02 MED ORDER — LOSARTAN POTASSIUM 25 MG PO TABS: 25.0000 mg | ORAL_TABLET | Freq: Every day | ORAL | 0 refills | Status: DC

## 2016-04-02 NOTE — Telephone Encounter (Signed)
Left patient detailed message as to medication and RN visit patient has lab scheduled for 04/06/16

## 2016-04-02 NOTE — Telephone Encounter (Signed)
Need script for losartan. Per your note.

## 2016-04-02 NOTE — Telephone Encounter (Signed)
Sent r.  Return for bmet and BP check one week

## 2016-04-02 NOTE — Telephone Encounter (Signed)
Pt called and stated that Dr. Derrel Nip was supposed to call in a second blood pressure medication. Pharmacy does not have anything. Please advise, thank you!  Pharmacy - [facility], [place] - [address] [place]  Call pt @ [phone]

## 2016-04-06 ENCOUNTER — Other Ambulatory Visit: Payer: Managed Care, Other (non HMO)

## 2016-04-11 ENCOUNTER — Other Ambulatory Visit: Payer: Managed Care, Other (non HMO)

## 2016-04-13 ENCOUNTER — Other Ambulatory Visit (INDEPENDENT_AMBULATORY_CARE_PROVIDER_SITE_OTHER): Payer: Managed Care, Other (non HMO)

## 2016-04-13 DIAGNOSIS — I10 Essential (primary) hypertension: Secondary | ICD-10-CM | POA: Diagnosis not present

## 2016-04-13 LAB — BASIC METABOLIC PANEL
BUN: 12 mg/dL (ref 6–23)
CALCIUM: 9.2 mg/dL (ref 8.4–10.5)
CO2: 30 mEq/L (ref 19–32)
CREATININE: 0.93 mg/dL (ref 0.40–1.50)
Chloride: 105 mEq/L (ref 96–112)
GFR: 86.17 mL/min (ref >60.00)
Glucose, Bld: 102 mg/dL — ABNORMAL HIGH (ref 70–99)
Potassium: 4.9 mEq/L (ref 3.5–5.1)
Sodium: 140 mEq/L (ref 135–145)

## 2016-04-15 ENCOUNTER — Encounter: Payer: Self-pay | Admitting: Internal Medicine

## 2016-05-26 ENCOUNTER — Other Ambulatory Visit: Payer: Self-pay | Admitting: Internal Medicine

## 2016-08-20 ENCOUNTER — Other Ambulatory Visit: Payer: Self-pay | Admitting: Internal Medicine

## 2016-08-20 ENCOUNTER — Encounter: Payer: Self-pay | Admitting: Internal Medicine

## 2016-08-20 MED ORDER — TADALAFIL 10 MG PO TABS: 10.0000 mg | ORAL_TABLET | Freq: Every day | ORAL | 5 refills | Status: DC | PRN

## 2016-08-20 NOTE — Telephone Encounter (Signed)
Refilled

## 2016-08-20 NOTE — Telephone Encounter (Signed)
Refilled:  Historical Last OV: 03/30/16 Last Labs: 04/13/16 Future OV: 10/01/16 Please advise?

## 2016-10-01 ENCOUNTER — Ambulatory Visit (INDEPENDENT_AMBULATORY_CARE_PROVIDER_SITE_OTHER): Payer: Managed Care, Other (non HMO) | Admitting: Internal Medicine

## 2016-10-01 ENCOUNTER — Encounter: Payer: Self-pay | Admitting: Internal Medicine

## 2016-10-01 VITALS — BP 146/96 | HR 68 | Temp 97.8°F | Resp 16 | Ht 72.0 in | Wt 216.8 lb

## 2016-10-01 DIAGNOSIS — Z125 Encounter for screening for malignant neoplasm of prostate: Secondary | ICD-10-CM | POA: Diagnosis not present

## 2016-10-01 DIAGNOSIS — L989 Disorder of the skin and subcutaneous tissue, unspecified: Secondary | ICD-10-CM | POA: Diagnosis not present

## 2016-10-01 DIAGNOSIS — D492 Neoplasm of unspecified behavior of bone, soft tissue, and skin: Secondary | ICD-10-CM

## 2016-10-01 DIAGNOSIS — E781 Pure hyperglyceridemia: Secondary | ICD-10-CM

## 2016-10-01 DIAGNOSIS — Z87891 Personal history of nicotine dependence: Secondary | ICD-10-CM | POA: Diagnosis not present

## 2016-10-01 DIAGNOSIS — Z0000 Encounter for general adult medical examination without abnormal findings: Secondary | ICD-10-CM

## 2016-10-01 DIAGNOSIS — E663 Overweight: Secondary | ICD-10-CM | POA: Diagnosis not present

## 2016-10-01 DIAGNOSIS — M109 Gout, unspecified: Secondary | ICD-10-CM

## 2016-10-01 DIAGNOSIS — Z79899 Other long term (current) drug therapy: Secondary | ICD-10-CM | POA: Diagnosis not present

## 2016-10-01 DIAGNOSIS — I10 Essential (primary) hypertension: Secondary | ICD-10-CM

## 2016-10-01 DIAGNOSIS — R5383 Other fatigue: Secondary | ICD-10-CM | POA: Diagnosis not present

## 2016-10-01 LAB — LIPID PANEL
CHOL/HDL RATIO: 3
CHOLESTEROL: 155 mg/dL (ref 0–200)
HDL: 49.8 mg/dL (ref >39.00)
LDL CALC: 74 mg/dL (ref 0–99)
NonHDL: 104.99
Triglycerides: 156 mg/dL — ABNORMAL HIGH (ref 0.0–149.0)
VLDL: 31.2 mg/dL (ref 0.0–40.0)

## 2016-10-01 LAB — COMPREHENSIVE METABOLIC PANEL
ALT: 19 U/L (ref 0–53)
AST: 31 U/L (ref 0–37)
Albumin: 4.5 g/dL (ref 3.5–5.2)
Alkaline Phosphatase: 53 U/L (ref 39–117)
BUN: 13 mg/dL (ref 6–23)
CO2: 28 meq/L (ref 19–32)
CREATININE: 0.94 mg/dL (ref 0.40–1.50)
Calcium: 9.5 mg/dL (ref 8.4–10.5)
Chloride: 105 mEq/L (ref 96–112)
GFR: 84.99 mL/min (ref >60.00)
Glucose, Bld: 92 mg/dL (ref 70–99)
POTASSIUM: 4.2 meq/L (ref 3.5–5.1)
SODIUM: 140 meq/L (ref 135–145)
Total Bilirubin: 1 mg/dL (ref 0.2–1.2)
Total Protein: 7.1 g/dL (ref 6.0–8.3)

## 2016-10-01 LAB — PSA: PSA: 2.2 ng/mL (ref 0.10–4.00)

## 2016-10-01 MED ORDER — HYDROCHLOROTHIAZIDE 25 MG PO TABS: 25.0000 mg | ORAL_TABLET | Freq: Every day | ORAL | 3 refills | Status: DC

## 2016-10-01 MED ORDER — TADALAFIL 10 MG PO TABS: 10.0000 mg | ORAL_TABLET | Freq: Every day | ORAL | 5 refills | Status: DC | PRN

## 2016-10-01 MED ORDER — AMLODIPINE BESYLATE 10 MG PO TABS: ORAL_TABLET | ORAL | 1 refills | Status: DC

## 2016-10-01 NOTE — Progress Notes (Signed)
Patient ID: Gerald Flores, male    DOB: 1950/09/22  Age: 66 y.o. MRN: [id number]  The patient is here for annual preventive  examination and management of other chronic and acute problems.  X pipe and cigar smoker, smoked for  30 years  Quit in 2008  Ran out of crestor , did not refill.    The risk factors are reflected in the social history.  DERmatology referral advised to evaluate hyperpigmented lesion  on forehead Due for colonoscopy,  Discussed  COLOGUARD as alternative given normal priior screening and average risk .  DICUSSED CT LUNGS  For lung CA screening    The roster of all physicians providing medical care to patient - is listed in the Snapshot section of the chart.  Activities of daily living:  The patient is 100% independent in all ADLs: dressing, toileting, feeding as well as independent mobility  Home safety : The patient has smoke detectors in the home. They wear seatbelts.  There are no firearms at home. There is no violence in the home.   There is no risks for hepatitis, STDs or HIV. There is no   history of blood transfusion. They have no travel history to infectious disease endemic areas of the world.  The patient has seen their dentist in the last six month. They have seen their eye doctor in the last year.    They do not  have excessive sun exposure. Discussed the need for sun protection: hats, long sleeves and use of sunscreen if there is significant sun exposure.   Diet: the importance of a healthy diet is discussed. They do have a healthy diet.  The benefits of regular aerobic exercise were discussed. She walks 4 times per week ,  20 minutes.   Depression screen: there are no signs or vegative symptoms of depression- irritability, change in appetite, anhedonia, sadness/tearfullness.  Cognitive assessment: the patient manages all their financial and personal affairs and is actively engaged. They could relate day,date,year and events; recalled 2/3 objects at 3  minutes; performed clock-face test normally.  The following portions of the patient's history were reviewed and updated as appropriate: allergies, current medications, past family history, past medical history,  past surgical history, past social history  and problem list.  Visual acuity was not assessed per patient preference since she has regular follow up with her ophthalmologist. Hearing and body mass index were assessed and reviewed.   During the course of the visit the patient was educated and counseled about appropriate screening and preventive services including : fall prevention , diabetes screening, nutrition counseling, colorectal cancer screening, and recommended immunizations.    CC: The primary encounter diagnosis was Gouty arthritis. Diagnoses of Essential hypertension, Hypertriglyceridemia, Screening for prostate cancer, Fatigue, unspecified type, Benign skin lesion of forehead, Neoplasm of skin of forehead, History of tobacco abuse, Encounter for long-term (current) use of high-risk medication, Overweight (BMI 25.0-29.9), and Visit for preventive health examination were also pertinent to this visit.  Hypertension:  Stopped losartan due to recurrent headaches . Stopped it over 5 months ago   Home readings 130/90 takes amlodpine  in the evening   Hyperlipidemia:  Stopped the crestor as well   .   History Gerald Flores has a past medical history of Gouty arthritis; Hypertension; Hypertriglyceridemia; Special screening for malignant neoplasm of prostate; Special screening for malignant neoplasms, colon; and Transitional cell carcinoma ([place]) (1994).   He has no past surgical history on file.   His family history includes Hypertension  in his father and mother; Kidney disease in his mother; Schizophrenia in his brother.He reports that he has quit smoking. His smoking use included Pipe and Cigars. He has never used smokeless tobacco. He reports that he does not drink alcohol. His drug history  is not on file.  Outpatient Medications Prior to Visit  Medication Sig Dispense Refill  . Cholecalciferol (VITAMIN D3) 1000 units CAPS Take 1 capsule by mouth daily.    . colchicine 0.6 MG tablet Take 1 tablet (0.6 mg total) by mouth 2 (two) times daily. For 7 days, as needed  for gout flare 60 tablet 1  . Multiple Vitamin (MULTIVITAMIN) tablet Take 1 tablet by mouth daily.    . Omega-3 Fatty Acids (FISH OIL) 1000 MG CAPS Take by mouth as directed    . vitamin E 400 UNIT capsule Take 400 Units by mouth daily.    Marland Kitchen amLODipine (NORVASC) 10 MG tablet TAKE 1 TABLET(10 MG) BY MOUTH DAILY 90 tablet 1  . tadalafil (CIALIS) 10 MG tablet Take 1 tablet (10 mg total) by mouth daily as needed. 10 tablet 5  . rosuvastatin (CRESTOR) 20 MG tablet Take 1 tablet (20 mg total) by mouth at bedtime. 90 tablet 1  . co-enzyme Q-10 30 MG capsule Take 1 capsule (30 mg total) by mouth daily. (Patient not taking: Reported on 03/30/2016) 90 capsule 1  . losartan (COZAAR) 25 MG tablet Take 1 tablet (25 mg total) by mouth daily. (Patient not taking: Reported on 10/01/2016) 90 tablet 0   No facility-administered medications prior to visit.     Review of Systems   Patient denies headache, fevers, malaise, unintentional weight loss, skin rash, eye pain, sinus congestion and sinus pain, sore throat, dysphagia,  hemoptysis , cough, dyspnea, wheezing, chest pain, palpitations, orthopnea, edema, abdominal pain, nausea, melena, diarrhea, constipation, flank pain, dysuria, hematuria, urinary  Frequency, nocturia, numbness, tingling, seizures,  Focal weakness, Loss of consciousness,  Tremor, insomnia, depression, anxiety, and suicidal ideation.      Objective:   BP (!) 146/96 (BP Location: Left Arm, Patient Position: Sitting, Cuff Size: Normal)   Pulse 68   Temp 97.8 F (36.6 C) (Oral)   Resp 16   Ht 6' (1.829 m)   Wt 216 lb 12.8 oz (98.3 kg)   SpO2 95%   BMI 29.40 kg/m    Physical Exam   General appearance: alert,  cooperative and appears stated age Face:  Hyperpigmented macular lesion above right eyebrow.  Ears: normal TM's and external ear canals both ears Throat: lips, mucosa, and tongue normal; teeth and gums normal Neck: no adenopathy, no carotid bruit, supple, symmetrical, trachea midline and thyroid not enlarged, symmetric, no tenderness/mass/nodules Back: symmetric, no curvature. ROM normal. No CVA tenderness. Lungs: clear to auscultation bilaterally Heart: regular rate and rhythm, S1, S2 normal, no murmur, click, rub or gallop Abdomen: soft, non-tender; bowel sounds normal; no masses,  no organomegaly Pulses: 2+ and symmetric Skin: Skin color, texture, turgor normal. No rashes or lesions Lymph nodes: Cervical, supraclavicular, and axillary nodes normal.    Assessment & Plan:   Problem List Items Addressed This Visit    Visit for preventive health examination    Annual comprehensive preventive exam was done as well as an evaluation and management of acute and chronic conditions .  During the course of the visit the patient was educated and counseled about appropriate screening and preventive services including :  diabetes screening, lipid analysis with projected  10 year  risk for  CAD , nutrition counseling, prostate and colorectal cancer screening, and recommended immunizations.  Printed recommendations for health maintenance screenings was given. c  Lab Results  Component Value Date   PSA 2.20 10/01/2016   PSA 1.85 09/27/2015   PSA 2.53 02/05/2014         Screening for prostate cancer   Relevant Orders   PSA (Completed)   Overweight (BMI 25.0-29.9)    I have addressed  BMI and recommended a low glycemic index diet utilizing smaller more frequent meals to increase metabolism.  I have also recommended that patient start exercising with a goal of 30 minutes of aerobic exercise a minimum of 5 days per week.         Hypertriglyceridemia    Mild, He did not tolerate daily Crestor due  to myalgias, but is tolerating every other day dosing. .  Repeat lipids are improved and trigs are < 200 ,  Ldl 130.  No changes today  Lab Results  Component Value Date   CHOL 155 10/01/2016   HDL 49.80 10/01/2016   LDLCALC 74 10/01/2016   LDLDIRECT 130.3 02/05/2014   TRIG 156.0 (H) 10/01/2016   CHOLHDL 3 10/01/2016   Lab Results  Component Value Date   ALT 19 10/01/2016   AST 31 10/01/2016   ALKPHOS 53 10/01/2016   BILITOT 1.0 10/01/2016           Relevant Medications   hydrochlorothiazide (HYDRODIURIL) 25 MG tablet   tadalafil (CIALIS) 10 MG tablet   amLODipine (NORVASC) 10 MG tablet   Other Relevant Orders   Lipid panel (Completed)   Hypertension    Uncontrolled due to patient d/c losartan without notification due to recurrent headaches.  Adding hctz in the am,  Continue amlodipine at ight  Lab Results  Component Value Date   CREATININE 0.94 10/01/2016   Lab Results  Component Value Date   NA 140 10/01/2016   K 4.2 10/01/2016   CL 105 10/01/2016   CO2 28 10/01/2016         Relevant Medications   hydrochlorothiazide (HYDRODIURIL) 25 MG tablet   tadalafil (CIALIS) 10 MG tablet   amLODipine (NORVASC) 10 MG tablet   Other Relevant Orders   Comprehensive metabolic panel (Completed)   Gouty arthritis - Primary   Encounter for long-term (current) use of high-risk medication    Liver enzymes are normal, and renal function is normal as well.        Other Visit Diagnoses    Fatigue, unspecified type       Relevant Orders   Hepatitis C antibody (Completed)   Benign skin lesion of forehead       Neoplasm of skin of forehead       Relevant Orders   Ambulatory referral to Dermatology   History of tobacco abuse       Relevant Orders   CT CHEST LUNG CA SCREEN LOW DOSE W/O CM      I have discontinued Mr. Bramlett's co-enzyme Q-10 and losartan. I am also having him start on hydrochlorothiazide. Additionally, I am having him maintain his vitamin E, multivitamin,  Fish Oil, rosuvastatin, colchicine, Vitamin D3, tadalafil, and amLODipine.  Meds ordered this encounter  Medications  . hydrochlorothiazide (HYDRODIURIL) 25 MG tablet    Sig: Take 1 tablet (25 mg total) by mouth daily.    Dispense:  30 tablet    Refill:  3  . tadalafil (CIALIS) 10 MG tablet    Sig: Take 1 tablet (10  mg total) by mouth daily as needed.    Dispense:  10 tablet    Refill:  5  . amLODipine (NORVASC) 10 MG tablet    Sig: TAKE 1 TABLET(10 MG) BY MOUTH DAILY    Dispense:  90 tablet    Refill:  1    PLEASE FILL FOR 90 DAYS !!!!!    Medications Discontinued During This Encounter  Medication Reason  . co-enzyme Q-10 30 MG capsule Patient has not taken in last 30 days  . losartan (COZAAR) 25 MG tablet Patient has not taken in last 30 days  . tadalafil (CIALIS) 10 MG tablet Reorder  . amLODipine (NORVASC) 10 MG tablet Reorder    Follow-up: No Follow-up on file.   Crecencio Mc, MD

## 2016-10-01 NOTE — Patient Instructions (Addendum)
The new goals for optimal blood pressure management are 120/70 TO 130/80  .  I AM Starting you on hctz,  A MILD DIURETIC  in the AM for better blood pressure control.    you can start with 1/2 tablet ( 12.5 mg) . DAILY .  GIVE IT A WEEK BEFORE YOU ASSESS THE CHANGE .  Your Goal is to get your bp < 130/80    You are due for colon CA screening.  Consider cologuard, as an alternative to colonoscopy (check with [facility] to see if they cover it)  I weill refer you for a dermatology evaluation of the spot on your forehead  Referral for lung cancer screening as well  Hep C, lipids and prostate cancer screening today via bloodwork   Health Maintenance, Male A healthy lifestyle and preventive care is important for your health and wellness. Ask your health care provider about what schedule of regular examinations is right for you. What should I know about weight and diet? Eat a Healthy Diet  Eat plenty of vegetables, fruits, whole grains, low-fat dairy products, and lean protein.  Do not eat a lot of foods high in solid fats, added sugars, or salt.  Maintain a Healthy Weight Regular exercise can help you achieve or maintain a healthy weight. You should:  Do at least 150 minutes of exercise each week. The exercise should increase your heart rate and make you sweat (moderate-intensity exercise).  Do strength-training exercises at least twice a week.  Watch Your Levels of Cholesterol and Blood Lipids  Have your blood tested for lipids and cholesterol every 5 years starting at 67 years of age. If you are at high risk for heart disease, you should start having your blood tested when you are 67 years old. You may need to have your cholesterol levels checked more often if: ? Your lipid or cholesterol levels are high. ? You are older than 67 years of age. ? You are at high risk for heart disease.  What should I know about cancer screening? Many types of cancers can be detected early and may often be  prevented. Lung Cancer  You should be screened every year for lung cancer if: ? You are a current smoker who has smoked for at least 30 years. ? You are a former smoker who has quit within the past 15 years.  Talk to your health care provider about your screening options, when you should start screening, and how often you should be screened.  Colorectal Cancer  Routine colorectal cancer screening usually begins at 67 years of age and should be repeated every 5-10 years until you are 67 years old. You may need to be screened more often if early forms of precancerous polyps or small growths are found. Your health care provider may recommend screening at an earlier age if you have risk factors for colon cancer.  Your health care provider may recommend using home test kits to check for hidden blood in the stool.  A small camera at the end of a tube can be used to examine your colon (sigmoidoscopy or colonoscopy). This checks for the earliest forms of colorectal cancer.  Prostate and Testicular Cancer  Depending on your age and overall health, your health care provider may do certain tests to screen for prostate and testicular cancer.  Talk to your health care provider about any symptoms or concerns you have about testicular or prostate cancer.  Skin Cancer  Check your skin from head to  toe regularly.  Tell your health care provider about any new moles or changes in moles, especially if: ? There is a change in a mole's size, shape, or color. ? You have a mole that is larger than a pencil eraser.  Always use sunscreen. Apply sunscreen liberally and repeat throughout the day.  Protect yourself by wearing long sleeves, pants, a wide-brimmed hat, and sunglasses when outside.  What should I know about heart disease, diabetes, and high blood pressure?  If you are 73-99 years of age, have your blood pressure checked every 3-5 years. If you are 43 years of age or older, have your blood  pressure checked every year. You should have your blood pressure measured twice-once when you are at a hospital or clinic, and once when you are not at a hospital or clinic. Record the average of the two measurements. To check your blood pressure when you are not at a hospital or clinic, you can use: ? An automated blood pressure machine at a pharmacy. ? A home blood pressure monitor.  Talk to your health care provider about your target blood pressure.  If you are between 27-76 years old, ask your health care provider if you should take aspirin to prevent heart disease.  Have regular diabetes screenings by checking your fasting blood sugar level. ? If you are at a normal weight and have a low risk for diabetes, have this test once every three years after the age of 71. ? If you are overweight and have a high risk for diabetes, consider being tested at a younger age or more often.  A one-time screening for abdominal aortic aneurysm (AAA) by ultrasound is recommended for men aged 84-75 years who are current or former smokers. What should I know about preventing infection? Hepatitis B If you have a higher risk for hepatitis B, you should be screened for this virus. Talk with your health care provider to find out if you are at risk for hepatitis B infection. Hepatitis C Blood testing is recommended for:  Everyone born from [birth year] through 1966.  Anyone with known risk factors for hepatitis C.  Sexually Transmitted Diseases (STDs)  You should be screened each year for STDs including gonorrhea and chlamydia if: ? You are sexually active and are younger than 67 years of age. ? You are older than 67 years of age and your health care provider tells you that you are at risk for this type of infection. ? Your sexual activity has changed since you were last screened and you are at an increased risk for chlamydia or gonorrhea. Ask your health care provider if you are at risk.  Talk with your health  care provider about whether you are at high risk of being infected with HIV. Your health care provider may recommend a prescription medicine to help prevent HIV infection.  What else can I do?  Schedule regular health, dental, and eye exams.  Stay current with your vaccines (immunizations).  Do not use any tobacco products, such as cigarettes, chewing tobacco, and e-cigarettes. If you need help quitting, ask your health care provider.  Limit alcohol intake to no more than 2 drinks per day. One drink equals 12 ounces of beer, 5 ounces of wine, or 1 ounces of hard liquor.  Do not use street drugs.  Do not share needles.  Ask your health care provider for help if you need support or information about quitting drugs.  Tell your health care provider if you  often feel depressed.  Tell your health care provider if you have ever been abused or do not feel safe at home. This information is not intended to replace advice given to you by your health care provider. Make sure you discuss any questions you have with your health care provider. Document Released: 10/13/2007 Document Revised: 12/14/2015 Document Reviewed: 01/18/2015 Elsevier Interactive Patient Education  [employer].

## 2016-10-02 ENCOUNTER — Encounter: Payer: Self-pay | Admitting: Internal Medicine

## 2016-10-02 LAB — HEPATITIS C ANTIBODY: HCV AB: NEGATIVE

## 2016-10-02 NOTE — Assessment & Plan Note (Signed)
I have addressed  BMI and recommended a low glycemic index diet utilizing smaller more frequent meals to increase metabolism.  I have also recommended that patient start exercising with a goal of 30 minutes of aerobic exercise a minimum of 5 days per week.  

## 2016-10-02 NOTE — Assessment & Plan Note (Signed)
Uncontrolled due to patient d/c losartan without notification due to recurrent headaches.  Adding hctz in the am,  Continue amlodipine at ight  Lab Results  Component Value Date   CREATININE 0.94 10/01/2016   Lab Results  Component Value Date   NA 140 10/01/2016   K 4.2 10/01/2016   CL 105 10/01/2016   CO2 28 10/01/2016

## 2016-10-02 NOTE — Assessment & Plan Note (Signed)
Mild, He did not tolerate daily Crestor due to myalgias, but is tolerating every other day dosing. .  Repeat lipids are improved and trigs are < 200 ,  Ldl 130.  No changes today  Lab Results  Component Value Date   CHOL 155 10/01/2016   HDL 49.80 10/01/2016   LDLCALC 74 10/01/2016   LDLDIRECT 130.3 02/05/2014   TRIG 156.0 (H) 10/01/2016   CHOLHDL 3 10/01/2016   Lab Results  Component Value Date   ALT 19 10/01/2016   AST 31 10/01/2016   ALKPHOS 53 10/01/2016   BILITOT 1.0 10/01/2016

## 2016-10-02 NOTE — Assessment & Plan Note (Signed)
Annual comprehensive preventive exam was done as well as an evaluation and management of acute and chronic conditions .  During the course of the visit the patient was educated and counseled about appropriate screening and preventive services including :  diabetes screening, lipid analysis with projected  10 year  risk for CAD , nutrition counseling, prostate and colorectal cancer screening, and recommended immunizations.  Printed recommendations for health maintenance screenings was given. c  Lab Results  Component Value Date   PSA 2.20 10/01/2016   PSA 1.85 09/27/2015   PSA 2.53 02/05/2014

## 2016-10-02 NOTE — Assessment & Plan Note (Signed)
Liver enzymes are normal, and renal function is normal as well.

## 2016-10-03 ENCOUNTER — Encounter: Payer: Self-pay | Admitting: Internal Medicine

## 2016-10-03 ENCOUNTER — Telehealth: Payer: Self-pay | Admitting: *Deleted

## 2016-10-03 NOTE — Telephone Encounter (Signed)
Received referral for initial lung cancer screening scan. Contacted patient and obtained smoking history. Due to patient only smoking cigars and pipe in past he is not eligible for lung cancer screening CT scan. Discussed with patient that risk of lung cancer from these type of tobacco use is though to be lower than with cigarettes. However, patient is encouraged to report any symptoms of lung cancer to his providers. These symptoms are reviewed with the patient.

## 2016-10-04 ENCOUNTER — Telehealth: Payer: Self-pay

## 2016-10-04 NOTE — Telephone Encounter (Signed)
cologuard order has been faxed.

## 2016-10-04 NOTE — Telephone Encounter (Signed)
-----   Message from Crecencio Mc, MD sent at 10/02/2016 11:41 AM EDT ----- Needs cologuard

## 2016-11-06 ENCOUNTER — Other Ambulatory Visit: Payer: Self-pay | Admitting: Internal Medicine

## 2016-11-06 DIAGNOSIS — E781 Pure hyperglyceridemia: Secondary | ICD-10-CM

## 2017-02-07 ENCOUNTER — Other Ambulatory Visit: Payer: Self-pay | Admitting: Internal Medicine

## 2017-02-07 DIAGNOSIS — E781 Pure hyperglyceridemia: Secondary | ICD-10-CM

## 2017-04-10 ENCOUNTER — Other Ambulatory Visit: Payer: Self-pay | Admitting: Internal Medicine

## 2017-05-14 ENCOUNTER — Telehealth: Payer: Self-pay | Admitting: Internal Medicine

## 2017-05-14 DIAGNOSIS — E781 Pure hyperglyceridemia: Secondary | ICD-10-CM

## 2017-05-14 MED ORDER — ROSUVASTATIN CALCIUM 20 MG PO TABS
ORAL_TABLET | ORAL | 1 refills | Status: DC
Start: 2017-05-14 — End: 2017-06-17

## 2017-05-14 MED ORDER — AMLODIPINE BESYLATE 10 MG PO TABS: ORAL_TABLET | ORAL | 1 refills | Status: DC

## 2017-05-14 NOTE — Telephone Encounter (Signed)
Copied from [place] [id number]. Topic: General - Other >> May 14, 2017 12:05 PM Darl Householder, RMA wrote: Reason for CRM: Medication refill request for amlodipine 10 mg and rosuvastatin 20 mg to be sent to [facility]

## 2017-05-20 ENCOUNTER — Encounter: Payer: Self-pay | Admitting: Internal Medicine

## 2017-05-20 DIAGNOSIS — R5383 Other fatigue: Secondary | ICD-10-CM

## 2017-05-20 DIAGNOSIS — I10 Essential (primary) hypertension: Secondary | ICD-10-CM

## 2017-05-20 DIAGNOSIS — E559 Vitamin D deficiency, unspecified: Secondary | ICD-10-CM

## 2017-05-20 DIAGNOSIS — E781 Pure hyperglyceridemia: Secondary | ICD-10-CM

## 2017-05-21 NOTE — Telephone Encounter (Signed)
Ordered CMP, lipid panel, TSH, CBC, vitamin d, and microalbumin since the pt hasn't had anything other than the lipid and cmp since 2017. Is there anything else that needs to be ordered?

## 2017-06-13 ENCOUNTER — Telehealth: Payer: Self-pay

## 2017-06-13 NOTE — Telephone Encounter (Signed)
LMTCB. Need to schedule pt for a fasting lab appt. Labs have been ordered.   PEC may speak with pt.

## 2017-06-14 ENCOUNTER — Other Ambulatory Visit (INDEPENDENT_AMBULATORY_CARE_PROVIDER_SITE_OTHER): Payer: Medicare Other

## 2017-06-14 DIAGNOSIS — E781 Pure hyperglyceridemia: Secondary | ICD-10-CM | POA: Diagnosis not present

## 2017-06-14 DIAGNOSIS — I10 Essential (primary) hypertension: Secondary | ICD-10-CM | POA: Diagnosis not present

## 2017-06-14 DIAGNOSIS — R5383 Other fatigue: Secondary | ICD-10-CM | POA: Diagnosis not present

## 2017-06-14 DIAGNOSIS — E559 Vitamin D deficiency, unspecified: Secondary | ICD-10-CM

## 2017-06-14 LAB — CBC WITH DIFFERENTIAL/PLATELET
BASOS PCT: 1.9 % (ref 0.0–3.0)
Basophils Absolute: 0.1 10*3/uL (ref 0.0–0.1)
EOS ABS: 0.2 10*3/uL (ref 0.0–0.7)
Eosinophils Relative: 3.9 % (ref 0.0–5.0)
HEMATOCRIT: 43.3 % (ref 39.0–52.0)
Hemoglobin: 14.8 g/dL (ref 13.0–17.0)
Lymphocytes Relative: 35.7 % (ref 12.0–46.0)
Lymphs Abs: 1.9 10*3/uL (ref 0.7–4.0)
MCHC: 34.2 g/dL (ref 30.0–36.0)
MCV: 89.3 fl (ref 78.0–100.0)
Monocytes Absolute: 0.5 10*3/uL (ref 0.1–1.0)
Monocytes Relative: 9.1 % (ref 3.0–12.0)
NEUTROS ABS: 2.7 10*3/uL (ref 1.4–7.7)
Neutrophils Relative %: 49.4 % (ref 43.0–77.0)
PLATELETS: 269 10*3/uL (ref 150.0–400.0)
RBC: 4.85 Mil/uL (ref 4.22–5.81)
RDW: 13.4 % (ref 11.5–15.5)
WBC: 5.5 10*3/uL (ref 4.0–10.5)

## 2017-06-14 LAB — COMPREHENSIVE METABOLIC PANEL
ALK PHOS: 55 U/L (ref 39–117)
ALT: 18 U/L (ref 0–53)
AST: 25 U/L (ref 0–37)
Albumin: 4.4 g/dL (ref 3.5–5.2)
BILIRUBIN TOTAL: 0.9 mg/dL (ref 0.2–1.2)
BUN: 18 mg/dL (ref 6–23)
CO2: 29 mEq/L (ref 19–32)
CREATININE: 0.94 mg/dL (ref 0.40–1.50)
Calcium: 9.2 mg/dL (ref 8.4–10.5)
Chloride: 102 mEq/L (ref 96–112)
GFR: 84.81 mL/min (ref >60.00)
GLUCOSE: 115 mg/dL — AB (ref 70–99)
Potassium: 4.2 mEq/L (ref 3.5–5.1)
Sodium: 137 mEq/L (ref 135–145)
TOTAL PROTEIN: 7.3 g/dL (ref 6.0–8.3)

## 2017-06-14 LAB — VITAMIN D 25 HYDROXY (VIT D DEFICIENCY, FRACTURES): VITD: 42.79 ng/mL (ref 30.00–100.00)

## 2017-06-14 LAB — LIPID PANEL
Cholesterol: 258 mg/dL — ABNORMAL HIGH (ref 0–200)
HDL: 42.6 mg/dL (ref >39.00)
NONHDL: 215.71
TRIGLYCERIDES: 286 mg/dL — AB (ref 0.0–149.0)
Total CHOL/HDL Ratio: 6
VLDL: 57.2 mg/dL — ABNORMAL HIGH (ref 0.0–40.0)

## 2017-06-14 LAB — LDL CHOLESTEROL, DIRECT: Direct LDL: 144 mg/dL

## 2017-06-16 ENCOUNTER — Encounter: Payer: Self-pay | Admitting: Internal Medicine

## 2017-06-17 ENCOUNTER — Telehealth: Payer: Self-pay | Admitting: Internal Medicine

## 2017-06-17 ENCOUNTER — Other Ambulatory Visit: Payer: Self-pay | Admitting: Internal Medicine

## 2017-06-17 MED ORDER — ATORVASTATIN CALCIUM 40 MG PO TABS: 40.0000 mg | ORAL_TABLET | Freq: Every day | ORAL | 3 refills | Status: DC

## 2017-06-17 NOTE — Progress Notes (Signed)
Needs cologard sent out

## 2017-06-17 NOTE — Telephone Encounter (Signed)
Please repeat the request for cologuard

## 2017-06-18 NOTE — Telephone Encounter (Signed)
Order form has been placed in quick sign folder.

## 2017-11-13 ENCOUNTER — Encounter: Payer: Self-pay | Admitting: *Deleted

## 2017-11-13 DIAGNOSIS — Z125 Encounter for screening for malignant neoplasm of prostate: Secondary | ICD-10-CM

## 2017-11-13 DIAGNOSIS — I10 Essential (primary) hypertension: Secondary | ICD-10-CM

## 2017-11-13 DIAGNOSIS — R7301 Impaired fasting glucose: Secondary | ICD-10-CM

## 2017-11-13 DIAGNOSIS — E781 Pure hyperglyceridemia: Secondary | ICD-10-CM

## 2017-11-13 DIAGNOSIS — M109 Gout, unspecified: Secondary | ICD-10-CM

## 2017-11-15 ENCOUNTER — Telehealth: Payer: Self-pay | Admitting: Internal Medicine

## 2017-11-15 DIAGNOSIS — Z1211 Encounter for screening for malignant neoplasm of colon: Secondary | ICD-10-CM

## 2017-11-15 NOTE — Telephone Encounter (Signed)
plesae reorder cologuard. fasting labs including PSA ordered

## 2017-11-15 NOTE — Telephone Encounter (Signed)
LMTCB. PEC may speak with pt. Need to schedule pt a fasting lab appt. Labs have been ordered.   Cologuard has been ordered and faxed.

## 2017-11-18 ENCOUNTER — Other Ambulatory Visit (INDEPENDENT_AMBULATORY_CARE_PROVIDER_SITE_OTHER): Payer: Medicare Other

## 2017-11-18 DIAGNOSIS — R7301 Impaired fasting glucose: Secondary | ICD-10-CM | POA: Diagnosis not present

## 2017-11-18 DIAGNOSIS — Z125 Encounter for screening for malignant neoplasm of prostate: Secondary | ICD-10-CM

## 2017-11-18 DIAGNOSIS — I10 Essential (primary) hypertension: Secondary | ICD-10-CM

## 2017-11-18 DIAGNOSIS — E781 Pure hyperglyceridemia: Secondary | ICD-10-CM | POA: Diagnosis not present

## 2017-11-18 DIAGNOSIS — M109 Gout, unspecified: Secondary | ICD-10-CM | POA: Diagnosis not present

## 2017-11-18 LAB — COMPREHENSIVE METABOLIC PANEL
ALT: 20 U/L (ref 0–53)
AST: 22 U/L (ref 0–37)
Albumin: 4.2 g/dL (ref 3.5–5.2)
Alkaline Phosphatase: 53 U/L (ref 39–117)
BILIRUBIN TOTAL: 0.9 mg/dL (ref 0.2–1.2)
BUN: 18 mg/dL (ref 6–23)
CO2: 29 meq/L (ref 19–32)
CREATININE: 0.99 mg/dL (ref 0.40–1.50)
Calcium: 9.3 mg/dL (ref 8.4–10.5)
Chloride: 103 mEq/L (ref 96–112)
GFR: 79.79 mL/min (ref >60.00)
GLUCOSE: 102 mg/dL — AB (ref 70–99)
Potassium: 5 mEq/L (ref 3.5–5.1)
Sodium: 138 mEq/L (ref 135–145)
Total Protein: 6.9 g/dL (ref 6.0–8.3)

## 2017-11-18 LAB — LIPID PANEL
Cholesterol: 133 mg/dL (ref 0–200)
HDL: 41.2 mg/dL (ref >39.00)
LDL Cholesterol: 69 mg/dL (ref 0–99)
NONHDL: 91.9
Total CHOL/HDL Ratio: 3
Triglycerides: 115 mg/dL (ref 0.0–149.0)
VLDL: 23 mg/dL (ref 0.0–40.0)

## 2017-11-18 LAB — PSA, MEDICARE: PSA: 2.35 ng/mL (ref 0.10–4.00)

## 2017-11-18 LAB — URIC ACID: Uric Acid, Serum: 6.8 mg/dL (ref 4.0–7.8)

## 2017-11-18 LAB — HEMOGLOBIN A1C: HEMOGLOBIN A1C: 5.6 % (ref 4.6–6.5)

## 2017-12-02 DIAGNOSIS — Z1211 Encounter for screening for malignant neoplasm of colon: Secondary | ICD-10-CM | POA: Diagnosis not present

## 2017-12-02 LAB — COLOGUARD: COLOGUARD: NEGATIVE

## 2017-12-10 NOTE — Telephone Encounter (Signed)
cologuard has been ordered and faxed.

## 2017-12-15 ENCOUNTER — Telehealth: Payer: Self-pay | Admitting: Internal Medicine

## 2017-12-15 NOTE — Telephone Encounter (Signed)
The results of patient's cologuard is negative.   We will repeat very 3 years For colon CA screening.

## 2017-12-16 NOTE — Telephone Encounter (Signed)
mychart message has been sent to inform patient of results.

## 2017-12-17 ENCOUNTER — Other Ambulatory Visit: Payer: Self-pay | Admitting: Internal Medicine

## 2018-01-31 ENCOUNTER — Encounter: Payer: Medicare Other | Admitting: Internal Medicine

## 2018-02-05 ENCOUNTER — Encounter: Payer: Self-pay | Admitting: Internal Medicine

## 2018-02-05 ENCOUNTER — Ambulatory Visit (INDEPENDENT_AMBULATORY_CARE_PROVIDER_SITE_OTHER): Payer: Medicare Other | Admitting: Internal Medicine

## 2018-02-05 DIAGNOSIS — Z0000 Encounter for general adult medical examination without abnormal findings: Secondary | ICD-10-CM | POA: Diagnosis not present

## 2018-02-05 DIAGNOSIS — E663 Overweight: Secondary | ICD-10-CM | POA: Diagnosis not present

## 2018-02-05 DIAGNOSIS — I10 Essential (primary) hypertension: Secondary | ICD-10-CM

## 2018-02-05 DIAGNOSIS — E781 Pure hyperglyceridemia: Secondary | ICD-10-CM

## 2018-02-05 NOTE — Progress Notes (Signed)
Patient ID: Gerald Flores, male    DOB: [birth year]  Age: 67 y.o. MRN: [id number]  The patient is here for follow up and  management of other chronic and acute problems.  Declines flu vaccine  The risk factors are reflected in the social history.  The roster of all physicians providing medical care to patient - is listed in the Snapshot section of the chart.  Activities of daily living:  The patient is 100% independent in all ADLs: dressing, toileting, feeding as well as independent mobility  Home safety : The patient has smoke detectors in the home. They wear seatbelts.  There are no firearms at home. There is no violence in the home.   There is no risks for hepatitis, STDs or HIV. There is no   history of blood transfusion. They have no travel history to infectious disease endemic areas of the world.  The patient has seen their dentist in the last six month. They have seen their eye doctor in the last year. They admit to slight hearing difficulty with regard to whispered voices and some television programs.  They have deferred audiologic testing in the last year.  They do not  have excessive sun exposure. Discussed the need for sun protection: hats, long sleeves and use of sunscreen if there is significant sun exposure.   Diet: the importance of a healthy diet is discussed. They do have a healthy diet.  The benefits of regular aerobic exercise were discussed. She walks 4 times per week ,  20 minutes.   Depression screen: there are no signs or vegative symptoms of depression- irritability, change in appetite, anhedonia, sadness/tearfullness.  Cognitive assessment: the patient manages all their financial and personal affairs and is actively engaged. They could relate day,date,year and events; recalled 2/3 objects at 3 minutes; performed clock-face test normally.  The following portions of the patient's history were reviewed and updated as appropriate: allergies, current medications, past family  history, past medical history,  past surgical history, past social history  and problem list.  Visual acuity was not assessed per patient preference since she has regular follow up with her ophthalmologist. Hearing and body mass index were assessed and reviewed.   During the course of the visit the patient was educated and counseled about appropriate screening and preventive services including : fall prevention , diabetes screening, nutrition counseling, colorectal cancer screening, and recommended immunizations.    CC: Diagnoses of Overweight (BMI 25.0-29.9), Visit for preventive health examination, Essential hypertension, and Hypertriglyceridemia were pertinent to this visit.  History Gerald Flores has a past medical history of Gouty arthritis, Hypertension, Hypertriglyceridemia, Special screening for malignant neoplasm of prostate, Special screening for malignant neoplasms, colon, and Transitional cell carcinoma ([place]) (1994).   Gerald Flores has no past surgical history on file.   Gerald Flores family history includes Hypertension in Gerald Flores father and mother; Kidney disease in Gerald Flores mother; Schizophrenia in Gerald Flores brother.Gerald Flores reports that Gerald Flores has quit smoking. Gerald Flores smoking use included pipe and cigars. Gerald Flores has never used smokeless tobacco. Gerald Flores reports that Gerald Flores does not drink alcohol. Gerald Flores drug history is not on file.  Outpatient Medications Prior to Visit  Medication Sig Dispense Refill  . amLODipine (NORVASC) 10 MG tablet TAKE ONE TABLET BY MOUTH DAILY 90 tablet 0  . atorvastatin (LIPITOR) 40 MG tablet Take 1 tablet (40 mg total) by mouth daily. 90 tablet 3  . Cholecalciferol (VITAMIN D3) 1000 units CAPS Take 1 capsule by mouth daily.    . colchicine 0.6 MG tablet  Take 1 tablet (0.6 mg total) by mouth 2 (two) times daily. For 7 days, as needed  for gout flare 60 tablet 1  . Multiple Vitamin (MULTIVITAMIN) tablet Take 1 tablet by mouth daily.    . Omega-3 Fatty Acids (FISH OIL) 1000 MG CAPS Take by mouth as directed    .  hydrochlorothiazide (HYDRODIURIL) 25 MG tablet Take 1 tablet (25 mg total) by mouth daily. (Patient not taking: Reported on 02/05/2018) 30 tablet 3  . tadalafil (CIALIS) 10 MG tablet Take 1 tablet (10 mg total) by mouth daily as needed. (Patient not taking: Reported on 02/05/2018) 10 tablet 5  . amLODipine (NORVASC) 10 MG tablet TAKE 1 TABLET(10 MG) BY MOUTH DAILY (Patient not taking: Reported on 02/05/2018) 90 tablet 0  . vitamin E 400 UNIT capsule Take 400 Units by mouth daily.     No facility-administered medications prior to visit.     Review of Systems  Objective:  BP 132/80 (BP Location: Left Arm, Patient Position: Sitting, Cuff Size: Normal)   Pulse (!) 55   Temp 97.7 F (36.5 C) (Oral)   Resp 14   Ht 6' (1.829 m)   Wt 203 lb (92.1 kg)   SpO2 97%   BMI 27.53 kg/m   Physical Exam    Assessment & Plan:   Problem List Items Addressed This Visit    Hypertension    improved with weight loss.  Reducing dose of amlodipine to 5 mg daily       Hypertriglyceridemia    Mild, Gerald Flores did not tolerate daily Crestor due to myalgias, but is tolerating every other day dosing. .  Repeat lipids are improved and trigs are < 200 ,  Ldl 130.  No changes today  Lab Results  Component Value Date   CHOL 133 11/18/2017   HDL 41.20 11/18/2017   LDLCALC 69 11/18/2017   LDLDIRECT 144.0 06/14/2017   TRIG 115.0 11/18/2017   CHOLHDL 3 11/18/2017   Lab Results  Component Value Date   ALT 20 11/18/2017   AST 22 11/18/2017   ALKPHOS 53 11/18/2017   BILITOT 0.9 11/18/2017           Overweight (BMI 25.0-29.9)    Gerald Flores states that Gerald Flores  has lost 23 lbs since April intentionally due to wife's diagnosis of prediabetes . I have congratulated him  in reduction of   BMI and encouraged  Continued weight loss with goal of 10% of body weigh over the next 6 months using a low glycemic index diet and regular exercise a minimum of 5 days per week.         Visit for preventive health examination    Annual  comprehensive preventive exam was done as well as an evaluation and management of acute and chronic conditions .  During the course of the visit the patient was educated and counseled about appropriate screening and preventive services including :  diabetes screening, lipid analysis with projected  10 year  risk for CAD , nutrition counseling, prostate and colorectal cancer screening, and recommended immunizations.  Printed recommendations for health maintenance screenings was given.   Lab Results  Component Value Date   PSA 2.35 11/18/2017   PSA 2.20 10/01/2016   PSA 1.85 09/27/2015           A total of 40 minutes was spent with patient more than half of which was spent in counseling patient on the above mentioned issues , reviewing and explaining recent labs and imaging  studies done, and coordination of care.  I have discontinued Saheed Carrington. Crossin's vitamin E. I am also having him maintain Gerald Flores multivitamin, Fish Oil, colchicine, Vitamin D3, hydrochlorothiazide, tadalafil, atorvastatin, and amLODipine.  No orders of the defined types were placed in this encounter.   Medications Discontinued During This Encounter  Medication Reason  . amLODipine (NORVASC) 10 MG tablet Duplicate  . vitamin E 400 UNIT capsule Patient Preference    Follow-up: No follow-ups on file.   Crecencio Mc, MD

## 2018-02-05 NOTE — Patient Instructions (Addendum)
  Congratulations on your weight loss!  It has reduced your blood pressure !  I want you to reduce the dose of  amlodipine to 5 mg and follow your readings  for 2 weeks.  Send me the readings   Goals is to keep your blood pressure  130/70 or less  And above 100/60   Continue atorvastatin.  You do not need to take a baby aspirin daily.   Here are a couple of foods that are low carb and satisyfing:  Breyer's  Carb Smart  Fudgsicle and Ice Cream  Dannon Lt n Fit  [place] Yogurt  Makes great  Manufacturing engineer,  Top with [employer]   Try the SOLA low carb bread ([employer]  Frozen bread )  3 g/slice  Tastes great!  Spaghetti squash  For spaghettig low carb   [place]'s  Pasta   Tastes great    You can take the megadose of D once per WEEK instead of 1000 ius daily

## 2018-02-08 NOTE — Assessment & Plan Note (Signed)
Mild, He did not tolerate daily Crestor due to myalgias, but is tolerating every other day dosing. .  Repeat lipids are improved and trigs are < 200 ,  Ldl 130.  No changes today  Lab Results  Component Value Date   CHOL 133 11/18/2017   HDL 41.20 11/18/2017   LDLCALC 69 11/18/2017   LDLDIRECT 144.0 06/14/2017   TRIG 115.0 11/18/2017   CHOLHDL 3 11/18/2017   Lab Results  Component Value Date   ALT 20 11/18/2017   AST 22 11/18/2017   ALKPHOS 53 11/18/2017   BILITOT 0.9 11/18/2017

## 2018-02-08 NOTE — Assessment & Plan Note (Signed)
He states that he  has lost 23 lbs since April intentionally due to wife's diagnosis of prediabetes . I have congratulated him  in reduction of   BMI and encouraged  Continued weight loss with goal of 10% of body weigh over the next 6 months using a low glycemic index diet and regular exercise a minimum of 5 days per week.

## 2018-02-08 NOTE — Assessment & Plan Note (Signed)
Annual comprehensive preventive exam was done as well as an evaluation and management of acute and chronic conditions .  During the course of the visit the patient was educated and counseled about appropriate screening and preventive services including :  diabetes screening, lipid analysis with projected  10 year  risk for CAD , nutrition counseling, prostate and colorectal cancer screening, and recommended immunizations.  Printed recommendations for health maintenance screenings was given.   Lab Results  Component Value Date   PSA 2.35 11/18/2017   PSA 2.20 10/01/2016   PSA 1.85 09/27/2015

## 2018-02-08 NOTE — Assessment & Plan Note (Signed)
improved with weight loss.  Reducing dose of amlodipine to 5 mg daily

## 2018-04-11 ENCOUNTER — Other Ambulatory Visit: Payer: Self-pay | Admitting: Internal Medicine

## 2018-05-13 ENCOUNTER — Telehealth: Payer: Self-pay | Admitting: Radiology

## 2018-05-13 DIAGNOSIS — Z79899 Other long term (current) drug therapy: Secondary | ICD-10-CM

## 2018-05-13 DIAGNOSIS — M109 Gout, unspecified: Secondary | ICD-10-CM

## 2018-05-13 DIAGNOSIS — E781 Pure hyperglyceridemia: Secondary | ICD-10-CM

## 2018-05-13 NOTE — Addendum Note (Signed)
Addended by: Crecencio Mc on: 05/13/2018 03:46 PM   Modules accepted: Orders

## 2018-05-13 NOTE — Telephone Encounter (Signed)
Fasting labs ordered

## 2018-05-13 NOTE — Telephone Encounter (Signed)
Pt coming in for labs tomorrow, please place future orders. Thank you.  

## 2018-05-14 ENCOUNTER — Other Ambulatory Visit (INDEPENDENT_AMBULATORY_CARE_PROVIDER_SITE_OTHER): Payer: Medicare Other

## 2018-05-14 DIAGNOSIS — M109 Gout, unspecified: Secondary | ICD-10-CM

## 2018-05-14 DIAGNOSIS — Z79899 Other long term (current) drug therapy: Secondary | ICD-10-CM | POA: Diagnosis not present

## 2018-05-14 DIAGNOSIS — E781 Pure hyperglyceridemia: Secondary | ICD-10-CM | POA: Diagnosis not present

## 2018-05-14 LAB — LIPID PANEL
CHOL/HDL RATIO: 3
Cholesterol: 137 mg/dL (ref 0–200)
HDL: 48.1 mg/dL (ref >39.00)
LDL Cholesterol: 72 mg/dL (ref 0–99)
NonHDL: 88.4
TRIGLYCERIDES: 84 mg/dL (ref 0.0–149.0)
VLDL: 16.8 mg/dL (ref 0.0–40.0)

## 2018-05-14 LAB — COMPREHENSIVE METABOLIC PANEL
ALK PHOS: 55 U/L (ref 39–117)
ALT: 17 U/L (ref 0–53)
AST: 22 U/L (ref 0–37)
Albumin: 4 g/dL (ref 3.5–5.2)
BILIRUBIN TOTAL: 0.7 mg/dL (ref 0.2–1.2)
BUN: 16 mg/dL (ref 6–23)
CALCIUM: 9 mg/dL (ref 8.4–10.5)
CO2: 28 meq/L (ref 19–32)
CREATININE: 0.93 mg/dL (ref 0.40–1.50)
Chloride: 105 mEq/L (ref 96–112)
GFR: 85.63 mL/min (ref >60.00)
GLUCOSE: 101 mg/dL — AB (ref 70–99)
Potassium: 4.3 mEq/L (ref 3.5–5.1)
Sodium: 139 mEq/L (ref 135–145)
Total Protein: 6.8 g/dL (ref 6.0–8.3)

## 2018-05-14 LAB — URIC ACID: Uric Acid, Serum: 6.6 mg/dL (ref 4.0–7.8)

## 2018-07-17 ENCOUNTER — Other Ambulatory Visit: Payer: Self-pay | Admitting: Internal Medicine

## 2018-10-01 ENCOUNTER — Other Ambulatory Visit: Payer: Self-pay | Admitting: Internal Medicine

## 2018-11-19 ENCOUNTER — Other Ambulatory Visit: Payer: Self-pay

## 2018-11-19 MED ORDER — AMLODIPINE BESYLATE 10 MG PO TABS: 10.0000 mg | ORAL_TABLET | Freq: Every day | ORAL | 0 refills | Status: DC

## 2018-11-20 ENCOUNTER — Other Ambulatory Visit: Payer: Self-pay | Admitting: Internal Medicine

## 2018-11-20 ENCOUNTER — Ambulatory Visit (INDEPENDENT_AMBULATORY_CARE_PROVIDER_SITE_OTHER): Payer: Medicare Other

## 2018-11-20 DIAGNOSIS — Z0000 Encounter for general adult medical examination without abnormal findings: Secondary | ICD-10-CM | POA: Diagnosis not present

## 2018-11-20 NOTE — Patient Instructions (Addendum)
  Mr. Gerald Flores , Thank you for taking time to come for your Medicare Wellness Visit. I appreciate your ongoing commitment to your health goals. Please review the following plan we discussed and let me know if I can assist you in the future.   These are the goals we discussed: Goals      Patient Stated   . Follow up with Primary Care Provider (pt-stated)     Monitor staying cool and drinking plenty of fluids to avoid heat exhaustion.  Monitor for dizziness, nausea.          This is a list of the screening recommended for you and due dates:  Health Maintenance  Topic Date Due  . Pneumonia vaccines (2 of 2 - PPSV23) 09/26/2016  . Flu Shot  11/29/2018  . Cologuard (Stool DNA test)  12/02/2020  . Tetanus Vaccine  07/02/2023  .  Hepatitis C: One time screening is recommended by Center for Disease Control  (CDC) for  adults born from [birth year] through 1966.   Completed

## 2018-11-20 NOTE — Progress Notes (Signed)
Subjective:   Gerald Flores is a 68 y.o. male who presents for an Initial Medicare Annual Wellness Visit.  Review of Systems  No ROS.  Medicare Wellness Virtual Visit.  Visual/audio telehealth visit, UTA vital signs.   See social history for additional risk factors.   Cardiac Risk Factors include: advanced age (>33men, >68 women);male gender;hypertension    Objective:    Today's Vitals   There is no height or weight on file to calculate BMI.  Advanced Directives 11/20/2018  Does Patient Have a Medical Advance Directive? No  Would patient like information on creating a medical advance directive? Yes (MAU/Ambulatory/Procedural Areas - Information given)    Current Medications (verified) Outpatient Encounter Medications as of 11/20/2018  Medication Sig  . amLODipine (NORVASC) 10 MG tablet Take 1 tablet (10 mg total) by mouth daily.  Marland Kitchen atorvastatin (LIPITOR) 40 MG tablet TAKE ONE TABLET BY MOUTH DAILY  . Cholecalciferol (VITAMIN D3) 1000 units CAPS Take 1 capsule by mouth daily.  . Multiple Vitamin (MULTIVITAMIN) tablet Take 1 tablet by mouth daily.  . Omega-3 Fatty Acids (FISH OIL) 1000 MG CAPS Take by mouth as directed  . colchicine 0.6 MG tablet Take 1 tablet (0.6 mg total) by mouth 2 (two) times daily. For 7 days, as needed  for gout flare (Patient not taking: Reported on 11/20/2018)  . hydrochlorothiazide (HYDRODIURIL) 25 MG tablet Take 1 tablet (25 mg total) by mouth daily. (Patient not taking: Reported on 02/05/2018)  . tadalafil (CIALIS) 10 MG tablet Take 1 tablet (10 mg total) by mouth daily as needed. (Patient not taking: Reported on 02/05/2018)   No facility-administered encounter medications on file as of 11/20/2018.     Allergies (verified) Patient has no known allergies.   History: Past Medical History:  Diagnosis Date  . Gouty arthritis   . Hypertension   . Hypertriglyceridemia   . Special screening for malignant neoplasm of prostate   . Special screening for  malignant neoplasms, colon   . Transitional cell carcinoma ([place]) 1994   treated 1994, Dr. Yves Dill   History reviewed. No pertinent surgical history. Family History  Problem Relation Age of Onset  . Hypertension Mother   . Kidney disease Mother   . Hypertension Father   . Schizophrenia Brother    Social History   Socioeconomic History  . Marital status: Married    Spouse name: Not on file  . Number of children: Not on file  . Years of education: Not on file  . Highest education level: Not on file  Occupational History  . Occupation: second shift supervisor  Social Needs  . Financial resource strain: Not hard at all  . Food insecurity    Worry: Never true    Inability: Never true  . Transportation needs    Medical: No    Non-medical: No  Tobacco Use  . Smoking status: Former Smoker    Types: Pipe, Landscape architect  . Smokeless tobacco: Never Used  . Tobacco comment: Quit 04/2006  Substance and Sexual Activity  . Alcohol use: No  . Drug use: Not on file  . Sexual activity: Not on file  Lifestyle  . Physical activity    Days per week: 5 days    Minutes per session: 30 min  . Stress: Not at all  Relationships  . Social Herbalist on phone: Not on file    Gets together: Not on file    Attends religious service: Not on file  Active member of club or organization: Not on file    Attends meetings of clubs or organizations: Not on file    Relationship status: Not on file  Other Topics Concern  . Not on file  Social History Narrative   Lives with spouse, married May, 2009   Tobacco Counseling Counseling given: Not Answered Comment: Quit 04/2006   Clinical Intake:  Pre-visit preparation completed: Yes        Diabetes: No  How often do you need to have someone help you when you read instructions, pamphlets, or other written materials from your doctor or pharmacy?: 1 - Never  Interpreter Needed?: No     Activities of Daily Living In your present state of  health, do you have any difficulty performing the following activities: 11/20/2018  Hearing? N  Vision? N  Difficulty concentrating or making decisions? N  Walking or climbing stairs? N  Dressing or bathing? N  Doing errands, shopping? N  Preparing Food and eating ? N  Using the Toilet? N  In the past six months, have you accidently leaked urine? N  Do you have problems with loss of bowel control? N  Managing your Medications? N  Managing your Finances? N  Housekeeping or managing your Housekeeping? N  Some recent data might be hidden     Immunizations and Health Maintenance Immunization History  Administered Date(s) Administered  . Pneumococcal Conjugate-13 09/27/2015  . Tdap 07/01/2013  . Zoster Recombinat (Shingrix) 12/20/2017, 04/15/2018   Health Maintenance Due  Topic Date Due  . PNA vac Low Risk Adult (2 of 2 - PPSV23) 09/26/2016    Patient Care Team: Crecencio Mc, MD as PCP - General (Internal Medicine)  Indicate any recent Medical Services you may have received from other than [facility] providers in the past year (date may be approximate).    Assessment:   This is a routine wellness examination for Gerald Flores.  I connected with patient 11/20/18 at 11:00 AM EDT by an audio enabled telemedicine application and verified that I am speaking with the correct person using two identifiers. Patient stated full name and DOB. Patient gave permission to continue with virtual visit. Patient's location was at home and Nurse's location was at [place] office.   Health Screenings  Cologuard- 11/2017 Colonoscopy- 08/2006 Glaucoma -none Hearing -demonstrates normal hearing during visit. Hemoglobin A1C - 10/2017 Cholesterol - 04/2018 Hepatitis C Screening- 09/2016 PSA- 10/2017 Dental- visits every 6 months Vision- visits within the last 12 months  Social  Alcohol intake - no          Smoking history- former    Smokers in home? none Illicit drug use? none Physical activity- [place]  around the home on the farm.  Diet - healthy diet; vegetables,lean meats. Home cooked meals.  Sexually Active -not currently BMI- discussed the importance of a healthy diet, water intake and the benefits of aerobic exercise.  Educational material provided.   Safety  Patient feels safe at home- yes Patient does have smoke detectors at home- yes Patient does wear sunscreen or protective clothing when in direct sunlight -yes Patient does wear seat belt when in a moving vehicle -yes Patient drives- yes  IRWER-15 precautions and sickness symptoms discussed.   Activities of Daily Living Patient denies needing assistance with: driving, household chores, feeding themselves, getting from bed to chair, getting to the toilet, bathing/showering, dressing, managing money, or preparing meals.  No new identified risk were noted.    Depression Screen Patient denies losing interest  in daily life, feeling hopeless, or crying easily over simple problems.   Medication-taking as directed and without issues.   Fall Screen Patient denies being afraid of falling or falling in the last year.   Memory Screen Patient is alert.  Patient denies difficulty focusing, concentrating or misplacing items. Correctly identified the president of the [place], season and recall.  Immunizations The following Immunizations were discussed: Influenza, shingles, pneumonia, and tetanus.   Other Providers Patient Care Team: Crecencio Mc, MD as PCP - General (Internal Medicine)  Hearing/Vision screen  Hearing Screening   125Hz  250Hz  500Hz  1000Hz  2000Hz  3000Hz  4000Hz  6000Hz  8000Hz   Right ear:           Left ear:           Comments: Patient is able to hear conversational tones without difficulty.  No issues reported.  Vision Screening Comments: Wears corrective lenses Visual acuity not assessed, virtual visit.  They have seen their ophthalmologist in the last 12 months.    Dietary issues and exercise activities  discussed: Current Exercise Habits: Home exercise routine, Type of exercise: walking;stretching, Time (Minutes): 30, Frequency (Times/Week): 5, Weekly Exercise (Minutes/Week): 150, Intensity: Mild  Goals      Patient Stated   . Follow up with Primary Care Provider (pt-stated)     Monitor staying cool and drinking plenty of fluids to avoid heat exhaustion.  Monitor for dizziness, nausea.         Depression Screen PHQ 2/9 Scores 11/20/2018 02/05/2018 03/30/2016 02/05/2014  PHQ - 2 Score 0 0 0 0  PHQ- 9 Score - 0 - -    Fall Risk Fall Risk  11/20/2018 02/05/2018 03/30/2016 02/05/2014  Falls in the past year? 0 No No No  Is the patient's home free of loose throw rugs in walkways, pet beds, electrical cords, etc?  yes      Grab bars in the bathroom? yes      Handrails on the stairs? yes      Adequate lighting? yes  Cognitive Function:     6CIT Screen 11/20/2018  What Year? 0 points  What month? 0 points  What time? 0 points  Count back from 20 0 points  Months in reverse 0 points  Repeat phrase 0 points  Total Score 0    Screening Tests Health Maintenance  Topic Date Due  . PNA vac Low Risk Adult (2 of 2 - PPSV23) 09/26/2016  . INFLUENZA VACCINE  11/29/2018  . Fecal DNA (Cologuard)  12/02/2020  . TETANUS/TDAP  07/02/2023  . Hepatitis C Screening  Completed      Plan:    End of life planning; Advance aging; Advanced directives discussed.  Copy of current HCPOA/Living Will requested upon completion.     I have personally reviewed and noted the following in the patient's chart:   . Medical and social history . Use of alcohol, tobacco or illicit drugs  . Current medications and supplements . Functional ability and status . Nutritional status . Physical activity . Advanced directives . List of other physicians . Hospitalizations, surgeries, and ER visits in previous 12 months . Vitals . Screenings to include cognitive, depression, and falls . Referrals and appointments   In addition, I have reviewed and discussed with patient certain preventive protocols, quality metrics, and best practice recommendations. A written personalized care plan for preventive services as well as general preventive health recommendations were provided to patient.     Varney Biles, LPN   [id number]/0093

## 2019-04-15 ENCOUNTER — Other Ambulatory Visit: Payer: Self-pay

## 2019-04-15 ENCOUNTER — Ambulatory Visit (INDEPENDENT_AMBULATORY_CARE_PROVIDER_SITE_OTHER): Payer: Medicare Other

## 2019-04-15 DIAGNOSIS — Z23 Encounter for immunization: Secondary | ICD-10-CM | POA: Diagnosis not present

## 2019-04-15 NOTE — Progress Notes (Addendum)
Pt in for HD flu shot. Administered in L arm. Tolerated well. No concerns.     I have reviewed the above information and agree with above.   Deborra Medina, MD

## 2019-05-23 ENCOUNTER — Other Ambulatory Visit: Payer: Self-pay | Admitting: Internal Medicine

## 2019-06-03 ENCOUNTER — Telehealth: Payer: Self-pay | Admitting: Internal Medicine

## 2019-06-03 DIAGNOSIS — Z125 Encounter for screening for malignant neoplasm of prostate: Secondary | ICD-10-CM

## 2019-06-03 DIAGNOSIS — R7301 Impaired fasting glucose: Secondary | ICD-10-CM

## 2019-06-03 DIAGNOSIS — I10 Essential (primary) hypertension: Secondary | ICD-10-CM

## 2019-06-03 DIAGNOSIS — E781 Pure hyperglyceridemia: Secondary | ICD-10-CM

## 2019-06-03 NOTE — Telephone Encounter (Signed)
Pt needs a refill on amLODipine (NORVASC) 10 MG tablet sent to [facility] Pt also wants labs done

## 2019-06-04 MED ORDER — AMLODIPINE BESYLATE 10 MG PO TABS: 10.0000 mg | ORAL_TABLET | Freq: Every day | ORAL | 0 refills | Status: DC

## 2019-06-04 NOTE — Telephone Encounter (Signed)
Spoke with pt to let him that he has not been seen since 2019 and he is due for an appt. Pt has been scheduled for both a virtual visit and a lab appt. I have ordered a microalbumin, PSA, lipid, and cmp. Is there anything else that needs to be ordered?

## 2019-06-04 NOTE — Addendum Note (Signed)
Addended by: Adair Laundry on: 06/04/2019 04:05 PM   Modules accepted: Orders

## 2019-06-04 NOTE — Addendum Note (Signed)
Addended by: Crecencio Mc on: 06/04/2019 09:09 PM   Modules accepted: Orders

## 2019-06-04 NOTE — Telephone Encounter (Signed)
Added an A1c

## 2019-06-15 ENCOUNTER — Other Ambulatory Visit: Payer: Self-pay

## 2019-06-17 ENCOUNTER — Other Ambulatory Visit (INDEPENDENT_AMBULATORY_CARE_PROVIDER_SITE_OTHER): Payer: Medicare HMO

## 2019-06-17 ENCOUNTER — Other Ambulatory Visit: Payer: Self-pay

## 2019-06-17 DIAGNOSIS — I10 Essential (primary) hypertension: Secondary | ICD-10-CM

## 2019-06-17 DIAGNOSIS — Z125 Encounter for screening for malignant neoplasm of prostate: Secondary | ICD-10-CM

## 2019-06-17 DIAGNOSIS — E781 Pure hyperglyceridemia: Secondary | ICD-10-CM

## 2019-06-17 DIAGNOSIS — R7301 Impaired fasting glucose: Secondary | ICD-10-CM | POA: Diagnosis not present

## 2019-06-17 LAB — COMPREHENSIVE METABOLIC PANEL
ALT: 19 U/L (ref 0–53)
AST: 20 U/L (ref 0–37)
Albumin: 4 g/dL (ref 3.5–5.2)
Alkaline Phosphatase: 69 U/L (ref 39–117)
BUN: 21 mg/dL (ref 6–23)
CO2: 30 mEq/L (ref 19–32)
Calcium: 9.1 mg/dL (ref 8.4–10.5)
Chloride: 104 mEq/L (ref 96–112)
Creatinine, Ser: 0.98 mg/dL (ref 0.40–1.50)
GFR: 75.6 mL/min (ref >60.00)
Glucose, Bld: 87 mg/dL (ref 70–99)
Potassium: 4.5 mEq/L (ref 3.5–5.1)
Sodium: 140 mEq/L (ref 135–145)
Total Bilirubin: 0.8 mg/dL (ref 0.2–1.2)
Total Protein: 6.7 g/dL (ref 6.0–8.3)

## 2019-06-17 LAB — LIPID PANEL
Cholesterol: 161 mg/dL (ref 0–200)
HDL: 38.3 mg/dL — ABNORMAL LOW (ref >39.00)
NonHDL: 122.85
Total CHOL/HDL Ratio: 4
Triglycerides: 237 mg/dL — ABNORMAL HIGH (ref 0.0–149.0)
VLDL: 47.4 mg/dL — ABNORMAL HIGH (ref 0.0–40.0)

## 2019-06-17 LAB — LDL CHOLESTEROL, DIRECT: Direct LDL: 75 mg/dL

## 2019-06-17 LAB — PSA, MEDICARE: PSA: 2.2 ng/ml (ref 0.10–4.00)

## 2019-06-17 LAB — MICROALBUMIN / CREATININE URINE RATIO
Creatinine,U: 157.7 mg/dL
Microalb Creat Ratio: 8.9 mg/g (ref 0.0–30.0)
Microalb, Ur: 14 mg/dL — ABNORMAL HIGH (ref 0.0–1.9)

## 2019-06-17 LAB — HEMOGLOBIN A1C: Hgb A1c MFr Bld: 6.2 % (ref 4.6–6.5)

## 2019-06-20 ENCOUNTER — Other Ambulatory Visit: Payer: Self-pay

## 2019-06-20 ENCOUNTER — Ambulatory Visit: Payer: Medicare HMO | Attending: Internal Medicine

## 2019-06-20 DIAGNOSIS — Z23 Encounter for immunization: Secondary | ICD-10-CM | POA: Insufficient documentation

## 2019-06-20 NOTE — Progress Notes (Signed)
   Covid-19 Vaccination Clinic  Name:  Gerald Flores    MRN: [id number] DOB: 06-21-1950  06/20/2019  [name] was observed post Covid-19 immunization for 15 minutes without incidence. He was provided with Vaccine Information Sheet and instruction to access the V-Safe system.   [name] was instructed to call 911 with any severe reactions post vaccine: Marland Kitchen Difficulty breathing  . Swelling of your face and throat  . A fast heartbeat  . A bad rash all over your body  . Dizziness and weakness    Immunizations Administered    Name Date Dose VIS Date Route   Pfizer COVID-19 Vaccine 06/20/2019  2:55 PM 0.3 mL 04/10/2019 Intramuscular   Manufacturer: [place]   Lot: [id number]   [place]: [id number]

## 2019-06-26 ENCOUNTER — Encounter: Payer: Self-pay | Admitting: Internal Medicine

## 2019-06-26 ENCOUNTER — Other Ambulatory Visit: Payer: Self-pay

## 2019-06-26 ENCOUNTER — Ambulatory Visit: Payer: Medicare Other | Admitting: Internal Medicine

## 2019-06-26 ENCOUNTER — Telehealth (INDEPENDENT_AMBULATORY_CARE_PROVIDER_SITE_OTHER): Payer: Medicare HMO | Admitting: Internal Medicine

## 2019-06-26 VITALS — BP 130/85 | HR 66 | Temp 97.6°F | Ht 72.0 in | Wt 217.0 lb

## 2019-06-26 DIAGNOSIS — E781 Pure hyperglyceridemia: Secondary | ICD-10-CM

## 2019-06-26 DIAGNOSIS — Z0000 Encounter for general adult medical examination without abnormal findings: Secondary | ICD-10-CM

## 2019-06-26 DIAGNOSIS — Z125 Encounter for screening for malignant neoplasm of prostate: Secondary | ICD-10-CM

## 2019-06-26 DIAGNOSIS — M109 Gout, unspecified: Secondary | ICD-10-CM | POA: Diagnosis not present

## 2019-06-26 DIAGNOSIS — I10 Essential (primary) hypertension: Secondary | ICD-10-CM

## 2019-06-26 MED ORDER — LISINOPRIL 5 MG PO TABS: 5.0000 mg | ORAL_TABLET | Freq: Every day | ORAL | 3 refills | Status: DC

## 2019-06-26 NOTE — Progress Notes (Signed)
Virtual Visit via [email or web address]  This visit type was conducted due to national recommendations for restrictions regarding the COVID-19 pandemic (e.g. social distancing).  This format is felt to be most appropriate for this patient at this time.  All issues noted in this document were discussed and addressed.  No physical exam was performed (except for noted visual exam findings with Video Visits).   I connected with@ on 06/26/19 at  2:30 PM EST by a video enabled telemedicine application  and verified that I am speaking with the correct person using two identifiers. Location patient: home Location provider: work or home office Persons participating in the virtual visit: patient, provider  I discussed the limitations, risks, security and privacy concerns of performing an evaluation and management service by telephone and the availability of in person appointments. I also discussed with the patient that there may be a patient responsible charge related to this service. The patient expressed understanding and agreed to proceed.  Reason for visit: CPE   HPI:  The patient is here for annual preventive examination and management of other chronic and acute problems.   The risk factors are reflected in the social history.  The roster of all physicians providing medical care to patient - is listed in the Snapshot section of the chart.  Activities of daily living:  The patient is 100% independent in all ADLs: dressing, toileting, feeding as well as independent mobility  Home safety : The patient has smoke detectors in the home. They wear seatbelts.  There are no firearms at home. There is no violence in the home.   There is no risks for hepatitis, STDs or HIV. There is no   history of blood transfusion. They have no travel history to infectious disease endemic areas of the world.  The patient has seen their dentist in the last six month. They have seen their eye doctor in the last year. They admit to  slight hearing difficulty with regard to whispered voices and some television programs.  They have deferred audiologic testing in the last year.  They do not  have excessive sun exposure. Discussed the need for sun protection: hats, long sleeves and use of sunscreen if there is significant sun exposure.   Diet: the importance of a healthy diet is discussed. They do have a healthy diet.  The benefits of regular aerobic exercise were discussed. He has not been physically active for the past several months and has gained 12 lbs.    Depression screen: there are no signs or vegative symptoms of depression- irritability, change in appetite, anhedonia, sadness/tearfullness.  Cognitive assessment: the patient manages all their financial and personal affairs and is actively engaged. They could relate day,date,year and events; recalled 2/3 objects at 3 minutes; performed clock-face test normally.  The following portions of the patient's history were reviewed and updated as appropriate: allergies, current medications, past family history, past medical history,  past surgical history, past social history  and problem list.  Visual acuity was not assessed per patient preference since she has regular follow up with her ophthalmologist. Hearing and body mass index were assessed and reviewed.   During the course of the visit the patient was educated and counseled about appropriate screening and preventive services including : fall prevention , diabetes screening, nutrition counseling, colorectal cancer screening, and recommended immunizations.    Cc:    Prediabetes:  Discussed diagnosis,  Lifestyle and diet .  Elevated triglycerides : discussed today in the context of prediabetes and  weight gain.   HTN:  Taking meds as directed. Home readings of BP 134/85  Average,  Proteinuria noted on labs   ROS: See pertinent positives and negatives per HPI.  Past Medical History:  Diagnosis Date  . Gouty arthritis    . Hypertension   . Hypertriglyceridemia   . Special screening for malignant neoplasm of prostate   . Special screening for malignant neoplasms, colon   . Transitional cell carcinoma ([place]) 1994   treated 1994, Dr. Yves Dill    No past surgical history on file.  Family History  Problem Relation Age of Onset  . Hypertension Mother   . Kidney disease Mother   . Hypertension Father   . Schizophrenia Brother     SOCIAL HX:  reports that he has quit smoking. His smoking use included pipe and cigars. He has never used smokeless tobacco. He reports that he does not drink alcohol. No history on file for drug.   Current Outpatient Medications:  .  amLODipine (NORVASC) 10 MG tablet, Take 1 tablet (10 mg total) by mouth daily., Disp: 30 tablet, Rfl: 0 .  atorvastatin (LIPITOR) 40 MG tablet, TAKE ONE TABLET BY MOUTH DAILY, Disp: 90 tablet, Rfl: 2 .  Cholecalciferol (VITAMIN D3) 1000 units CAPS, Take 1 capsule by mouth daily., Disp: , Rfl:  .  colchicine 0.6 MG tablet, Take 1 tablet (0.6 mg total) by mouth 2 (two) times daily. For 7 days, as needed  for gout flare, Disp: 60 tablet, Rfl: 1 .  Multiple Vitamin (MULTIVITAMIN) tablet, Take 1 tablet by mouth daily., Disp: , Rfl:  .  Omega-3 Fatty Acids (FISH OIL) 1000 MG CAPS, Take by mouth as directed, Disp: , Rfl:  .  lisinopril (ZESTRIL) 5 MG tablet, Take 1 tablet (5 mg total) by mouth daily., Disp: 90 tablet, Rfl: 3  EXAM:  VITALS per patient if applicable:  GENERAL: alert, oriented, appears well and in no acute distress  HEENT: atraumatic, conjunttiva clear, no obvious abnormalities on inspection of external nose and ears  NECK: normal movements of the head and neck  LUNGS: on inspection no signs of respiratory distress, breathing rate appears normal, no obvious gross SOB, gasping or wheezing  CV: no obvious cyanosis  MS: moves all visible extremities without noticeable abnormality  PSYCH/NEURO: pleasant and cooperative, no obvious  depression or anxiety, speech and thought processing grossly intact  ASSESSMENT AND PLAN:  Discussed the following assessment and plan:  Essential hypertension - Plan: Basic metabolic panel  Hypertriglyceridemia  Visit for preventive health examination  Screening for prostate cancer  Gouty arthritis  Hypertension Adding lisinopril for improved control and new onset proteinuria. RTC 1 week for repeat BMET   Lab Results  Component Value Date   CREATININE 0.98 06/17/2019   Lab Results  Component Value Date   NA 140 06/17/2019   K 4.5 06/17/2019   CL 104 06/17/2019   CO2 30 06/17/2019   Lab Results  Component Value Date   MICROALBUR 14.0 (H) 06/17/2019   MICROALBUR 1.2 03/30/2016       Hypertriglyceridemia Mild,  Should improve with weight loss and log GI diet.   Lab Results  Component Value Date   CHOL 161 06/17/2019   HDL 38.30 (L) 06/17/2019   LDLCALC 72 05/14/2018   LDLDIRECT 75.0 06/17/2019   TRIG 237.0 (H) 06/17/2019   CHOLHDL 4 06/17/2019     Visit for preventive health examination age appropriate education and counseling updated, referrals for preventative services and immunizations addressed,  dietary and smoking counseling addressed, most recent labs reviewed.  I have personally reviewed and have noted:  1) the patient's medical and social history 2) The pt's use of alcohol, tobacco, and illicit drugs 3) The patient's current medications and supplements 4) Functional ability including ADL's, fall risk, home safety risk, hearing and visual impairment 5) Diet and physical activities 6) Evidence for depression or mood disorder 7) The patient's height, weight, and BMI have been recorded in the chart  I have made referrals, and provided counseling and education based on review of the above  Screening for prostate cancer PSA has been stable over the past 3 years   Lab Results  Component Value Date   PSA 2.20 06/17/2019   PSA 2.35 11/18/2017    PSA 2.20 10/01/2016      Gouty arthritis No recent attacks.  Uric acid level is >6.0 but given the infrequent episode, he does not want to take a daily medication   Lab Results  Component Value Date   LABURIC 6.6 05/14/2018       I discussed the assessment and treatment plan with the patient. The patient was provided an opportunity to ask questions and all were answered. The patient agreed with the plan and demonstrated an understanding of the instructions.   The patient was advised to call back or seek an in-person evaluation if the symptoms worsen or if the condition fails to improve as anticipated.  I provided  30 minutes of non-face-to-face time during this encounter reviewing patient's current problems and past surgeries, labs and imaging studies, providing counseling on the above mentioned problems , and coordination  of care .  Crecencio Mc, MD

## 2019-06-28 NOTE — Assessment & Plan Note (Signed)
Mild,  Should improve with weight loss and log GI diet.   Lab Results  Component Value Date   CHOL 161 06/17/2019   HDL 38.30 (L) 06/17/2019   LDLCALC 72 05/14/2018   LDLDIRECT 75.0 06/17/2019   TRIG 237.0 (H) 06/17/2019   CHOLHDL 4 06/17/2019

## 2019-06-28 NOTE — Assessment & Plan Note (Signed)
No recent attacks.  Uric acid level is >6.0 but given the infrequent episode, he does not want to take a daily medication   Lab Results  Component Value Date   LABURIC 6.6 05/14/2018

## 2019-06-28 NOTE — Assessment & Plan Note (Signed)
Adding lisinopril for improved control and new onset proteinuria. RTC 1 week for repeat BMET   Lab Results  Component Value Date   CREATININE 0.98 06/17/2019   Lab Results  Component Value Date   NA 140 06/17/2019   K 4.5 06/17/2019   CL 104 06/17/2019   CO2 30 06/17/2019   Lab Results  Component Value Date   MICROALBUR 14.0 (H) 06/17/2019   MICROALBUR 1.2 03/30/2016

## 2019-06-28 NOTE — Assessment & Plan Note (Signed)

## 2019-06-28 NOTE — Assessment & Plan Note (Signed)
PSA has been stable over the past 3 years   Lab Results  Component Value Date   PSA 2.20 06/17/2019   PSA 2.35 11/18/2017   PSA 2.20 10/01/2016

## 2019-07-02 DIAGNOSIS — E785 Hyperlipidemia, unspecified: Secondary | ICD-10-CM | POA: Diagnosis not present

## 2019-07-02 DIAGNOSIS — Z8551 Personal history of malignant neoplasm of bladder: Secondary | ICD-10-CM | POA: Diagnosis not present

## 2019-07-02 DIAGNOSIS — I10 Essential (primary) hypertension: Secondary | ICD-10-CM | POA: Diagnosis not present

## 2019-07-02 DIAGNOSIS — Z87891 Personal history of nicotine dependence: Secondary | ICD-10-CM | POA: Diagnosis not present

## 2019-07-04 ENCOUNTER — Other Ambulatory Visit: Payer: Self-pay | Admitting: Internal Medicine

## 2019-07-05 ENCOUNTER — Other Ambulatory Visit: Payer: Self-pay | Admitting: Internal Medicine

## 2019-07-15 ENCOUNTER — Ambulatory Visit: Payer: Medicare HMO | Attending: Internal Medicine

## 2019-07-15 DIAGNOSIS — Z23 Encounter for immunization: Secondary | ICD-10-CM

## 2019-07-15 NOTE — Progress Notes (Signed)
   Covid-19 Vaccination Clinic  Name:  SUFIAN JACHIM    MRN: [id number] DOB: October 22, 1950  07/15/2019  Mr. Popelka was observed post Covid-19 immunization for 15 minutes without incident. He was provided with Vaccine Information Sheet and instruction to access the V-Safe system.   Mr. Easterlin was instructed to call 911 with any severe reactions post vaccine: Marland Kitchen Difficulty breathing  . Swelling of face and throat  . A fast heartbeat  . A bad rash all over body  . Dizziness and weakness   Immunizations Administered    Name Date Dose VIS Date Route   Pfizer COVID-19 Vaccine 07/15/2019  1:59 PM 0.3 mL 04/10/2019 Intramuscular   Manufacturer: [place]   Lot: [id number]   [place]: [id number]

## 2019-07-22 DIAGNOSIS — H43812 Vitreous degeneration, left eye: Secondary | ICD-10-CM | POA: Diagnosis not present

## 2019-08-12 ENCOUNTER — Other Ambulatory Visit: Payer: Self-pay | Admitting: Internal Medicine

## 2019-09-02 ENCOUNTER — Encounter: Payer: Self-pay | Admitting: Internal Medicine

## 2019-09-02 ENCOUNTER — Ambulatory Visit (INDEPENDENT_AMBULATORY_CARE_PROVIDER_SITE_OTHER): Payer: Medicare HMO | Admitting: Internal Medicine

## 2019-09-02 ENCOUNTER — Other Ambulatory Visit: Payer: Self-pay

## 2019-09-02 VITALS — BP 140/90 | HR 65 | Temp 98.3°F | Resp 15 | Ht 72.0 in | Wt 218.2 lb

## 2019-09-02 DIAGNOSIS — R319 Hematuria, unspecified: Secondary | ICD-10-CM

## 2019-09-02 DIAGNOSIS — I10 Essential (primary) hypertension: Secondary | ICD-10-CM | POA: Diagnosis not present

## 2019-09-02 DIAGNOSIS — R310 Gross hematuria: Secondary | ICD-10-CM | POA: Diagnosis not present

## 2019-09-02 DIAGNOSIS — E781 Pure hyperglyceridemia: Secondary | ICD-10-CM

## 2019-09-02 LAB — LIPID PANEL
Cholesterol: 150 mg/dL (ref 0–200)
HDL: 43.2 mg/dL (ref >39.00)
LDL Cholesterol: 81 mg/dL (ref 0–99)
NonHDL: 106.75
Total CHOL/HDL Ratio: 3
Triglycerides: 129 mg/dL (ref 0.0–149.0)
VLDL: 25.8 mg/dL (ref 0.0–40.0)

## 2019-09-02 LAB — POCT URINALYSIS DIPSTICK
Bilirubin, UA: NEGATIVE
Glucose, UA: NEGATIVE
Ketones, UA: NEGATIVE
Leukocytes, UA: NEGATIVE
Nitrite, UA: NEGATIVE
Protein, UA: POSITIVE — AB
Spec Grav, UA: 1.01 (ref 1.010–1.025)
Urobilinogen, UA: 0.2 E.U./dL
pH, UA: 6.5 (ref 5.0–8.0)

## 2019-09-02 LAB — URINALYSIS, MICROSCOPIC ONLY

## 2019-09-02 LAB — HEPATIC FUNCTION PANEL
ALT: 19 U/L (ref 0–53)
AST: 24 U/L (ref 0–37)
Albumin: 4.2 g/dL (ref 3.5–5.2)
Alkaline Phosphatase: 66 U/L (ref 39–117)
Bilirubin, Direct: 0.1 mg/dL (ref 0.0–0.3)
Total Bilirubin: 0.8 mg/dL (ref 0.2–1.2)
Total Protein: 6.7 g/dL (ref 6.0–8.3)

## 2019-09-02 NOTE — Assessment & Plan Note (Signed)
Given history of transitional cell bladder CA in 1994,  Absence of pain and calculi and normal UA except for blood, will refer to Dr Valma Cava (Urology) for cystoscopy.  Urine culture is pending.

## 2019-09-02 NOTE — Assessment & Plan Note (Signed)
Mild,  With LDL at goal on Lipitor.   Lab Results  Component Value Date   CHOL 161 06/17/2019   HDL 38.30 (L) 06/17/2019   LDLCALC 72 05/14/2018   LDLDIRECT 75.0 06/17/2019   TRIG 237.0 (H) 06/17/2019   CHOLHDL 4 06/17/2019

## 2019-09-02 NOTE — Progress Notes (Signed)
Subjective:  Patient ID: Gerald Flores, male    DOB: [birth year]  Age: 69 y.o. MRN: [id number]  CC: The primary encounter diagnosis was Hematuria, unspecified type. Diagnoses of Gross hematuria, Hypertriglyceridemia, and Essential hypertension were also pertinent to this visit.  HPI Gerald Flores presents for evaluation of gross hematuria   This visit occurred during the SARS-CoV-2 public health emergency.  Safety protocols were in place, including screening questions prior to the visit, additional usage of staff PPE, and extensive cleaning of exam room while observing appropriate contact time as indicated for disinfecting solutions.    Patient has received both doses of the available COVID 19 vaccine without complications.  Patient continues to mask when outside of the home except when walking in yard or at safe distances from others .  Patient denies any change in mood or development of unhealthy behaviors resuting from the pandemic's restriction of activities and socialization.    2 week history of blood in urine, passing clots . No passing of stones,  No dysuria.  Has a  History of bladder cancer, transitional cell CA , tumor removed in 1994 and no recurrence when annual surveillance stopped in 2014.    No pain except in lower back I on the left in the area of recent injury. history of fall 3 weeks ago that resulted In blunt trauma to the left flank .   (fell on cement steps while carrying a large platn,  Back hit the corner of an outdoor metal shed .   The impact  Was hard and he had extensive bruising of left flank .  Since then has  Had a feeling of pressure.  Bleeding started a week after the blunt injury and has persisted for 2 weeks.  He has collected several clots which have no evidence of calculi by exam today .   Hypertension:  Fluctuates.   But all home readings have been < 125/80  Hyperlipidemia:  He has recently initiated therapy with statin and is Tolerating lipitor  Overdue for  follow up   Outpatient Medications Prior to Visit  Medication Sig Dispense Refill  . amLODipine (NORVASC) 10 MG tablet TAKE ONE TABLET BY MOUTH DAILY 30 tablet 0  . atorvastatin (LIPITOR) 40 MG tablet TAKE ONE TABLET BY MOUTH DAILY 90 tablet 1  . Cholecalciferol (VITAMIN D3) 1000 units CAPS Take 1 capsule by mouth daily.    . colchicine 0.6 MG tablet Take 1 tablet (0.6 mg total) by mouth 2 (two) times daily. For 7 days, as needed  for gout flare 60 tablet 1  . lisinopril (ZESTRIL) 5 MG tablet Take 1 tablet (5 mg total) by mouth daily. 90 tablet 3  . Multiple Vitamin (MULTIVITAMIN) tablet Take 1 tablet by mouth daily.    . Omega-3 Fatty Acids (FISH OIL) 1000 MG CAPS Take by mouth as directed     No facility-administered medications prior to visit.    Review of Systems;  Patient denies headache, fevers, malaise, unintentional weight loss, skin rash, eye pain, sinus congestion and sinus pain, sore throat, dysphagia,  hemoptysis , cough, dyspnea, wheezing, chest pain, palpitations, orthopnea, edema, abdominal pain, nausea, melena, diarrhea, constipation, flank pain, dysuria, hematuria, urinary  Frequency, nocturia, numbness, tingling, seizures,  Focal weakness, Loss of consciousness,  Tremor, insomnia, depression, anxiety, and suicidal ideation.      Objective:  BP 140/90 (BP Location: Left Arm, Patient Position: Sitting, Cuff Size: Normal)   Pulse 65   Temp 98.3 F (36.8 C) (  Temporal)   Resp 15   Ht 6' (1.829 m)   Wt 218 lb 3.2 oz (99 kg)   SpO2 98%   BMI 29.59 kg/m   BP Readings from Last 3 Encounters:  09/02/19 140/90  06/26/19 130/85  02/05/18 132/80    Wt Readings from Last 3 Encounters:  09/02/19 218 lb 3.2 oz (99 kg)  06/26/19 217 lb (98.4 kg)  02/05/18 203 lb (92.1 kg)    General appearance: alert, cooperative and appears stated age Ears: normal TM's and external ear canals both ears Throat: lips, mucosa, and tongue normal; teeth and gums normal Neck: no  adenopathy, no carotid bruit, supple, symmetrical, trachea midline and thyroid not enlarged, symmetric, no tenderness/mass/nodules Back: symmetric, no curvature. ROM normal. Mild flank tenderness on left ,  No CVA tenderness. Lungs: clear to auscultation bilaterally Heart: regular rate and rhythm, S1, S2 normal, no murmur, click, rub or gallop Abdomen: soft, non-tender; bowel sounds normal; no masses,  no organomegaly Pulses: 2+ and symmetric Skin: Skin color, texture, turgor normal. No rashes or lesions Lymph nodes: Cervical, supraclavicular, and axillary nodes normal.  Lab Results  Component Value Date   HGBA1C 6.2 06/17/2019   HGBA1C 5.6 11/18/2017    Lab Results  Component Value Date   CREATININE 0.98 06/17/2019   CREATININE 0.93 05/14/2018   CREATININE 0.99 11/18/2017    Lab Results  Component Value Date   WBC 5.5 06/14/2017   HGB 14.8 06/14/2017   HCT 43.3 06/14/2017   PLT 269.0 06/14/2017   GLUCOSE 87 06/17/2019   CHOL 161 06/17/2019   TRIG 237.0 (H) 06/17/2019   HDL 38.30 (L) 06/17/2019   LDLDIRECT 75.0 06/17/2019   LDLCALC 72 05/14/2018   ALT 19 06/17/2019   AST 20 06/17/2019   NA 140 06/17/2019   K 4.5 06/17/2019   CL 104 06/17/2019   CREATININE 0.98 06/17/2019   BUN 21 06/17/2019   CO2 30 06/17/2019   TSH 1.85 09/27/2015   PSA 2.20 06/17/2019   HGBA1C 6.2 06/17/2019   MICROALBUR 14.0 (H) 06/17/2019    Assessment & Plan:   Problem List Items Addressed This Visit      Unprioritized   Gross hematuria    Given history of transitional cell bladder CA in 1994,  Absence of pain and calculi and normal UA except for blood, will refer to Dr Valma Cava (Urology) for cystoscopy.  Urine culture is pending.       Relevant Orders   Ambulatory referral to Urology   Hypertension    Well controlled on current regimen. Lisinopril added because of proteinuria. Renal function  Has not been repeated as advised since starting lisinopril in February and has been done today  , pending .        Hypertriglyceridemia    Mild,  With LDL at goal on Lipitor.   Lab Results  Component Value Date   CHOL 161 06/17/2019   HDL 38.30 (L) 06/17/2019   LDLCALC 72 05/14/2018   LDLDIRECT 75.0 06/17/2019   TRIG 237.0 (H) 06/17/2019   CHOLHDL 4 06/17/2019         Relevant Orders   Hepatic function panel   Lipid panel    Other Visit Diagnoses    Hematuria, unspecified type    -  Primary   Relevant Orders   POCT Urinalysis Dipstick (Completed)   Urine Culture   Urine Microscopic Only      I have discontinued Chrissie Noa C. Aung's Fish Oil. I am also having  him maintain his multivitamin, colchicine, Vitamin D3, lisinopril, atorvastatin, and amLODipine.  No orders of the defined types were placed in this encounter.   Medications Discontinued During This Encounter  Medication Reason  . Omega-3 Fatty Acids (FISH OIL) 1000 MG CAPS Patient has not taken in last 30 days    Follow-up: No follow-ups on file.   Crecencio Mc, MD

## 2019-09-02 NOTE — Assessment & Plan Note (Addendum)
Well controlled on current regimen. Lisinopril added because of proteinuria. Renal function  Has not been repeated as advised since starting lisinopril in February and has been done today , pending .

## 2019-09-02 NOTE — Progress Notes (Signed)
po

## 2019-09-02 NOTE — Patient Instructions (Signed)
The bleeding may be due to a recurrence of bladder tumor given your lack of pain suggestive of stone.   Referral to Dr Valma Cava in progress  Will treat for URI IF CULTURE SUGGESTS  INFECTION

## 2019-09-03 LAB — URINE CULTURE
MICRO NUMBER:: 10442171
SPECIMEN QUALITY:: ADEQUATE

## 2019-09-08 ENCOUNTER — Other Ambulatory Visit: Payer: Self-pay | Admitting: Internal Medicine

## 2019-09-09 DIAGNOSIS — R310 Gross hematuria: Secondary | ICD-10-CM | POA: Diagnosis not present

## 2019-09-10 DIAGNOSIS — R310 Gross hematuria: Secondary | ICD-10-CM | POA: Diagnosis not present

## 2019-09-18 DIAGNOSIS — I10 Essential (primary) hypertension: Secondary | ICD-10-CM | POA: Diagnosis not present

## 2019-09-18 DIAGNOSIS — Z01818 Encounter for other preprocedural examination: Secondary | ICD-10-CM | POA: Diagnosis not present

## 2019-09-18 DIAGNOSIS — E781 Pure hyperglyceridemia: Secondary | ICD-10-CM | POA: Diagnosis not present

## 2019-09-18 DIAGNOSIS — M109 Gout, unspecified: Secondary | ICD-10-CM | POA: Diagnosis not present

## 2019-09-18 DIAGNOSIS — D494 Neoplasm of unspecified behavior of bladder: Secondary | ICD-10-CM | POA: Diagnosis not present

## 2019-09-18 DIAGNOSIS — R310 Gross hematuria: Secondary | ICD-10-CM | POA: Diagnosis not present

## 2019-09-21 DIAGNOSIS — Z20822 Contact with and (suspected) exposure to covid-19: Secondary | ICD-10-CM | POA: Diagnosis not present

## 2019-09-24 DIAGNOSIS — Z6830 Body mass index (BMI) 30.0-30.9, adult: Secondary | ICD-10-CM | POA: Diagnosis not present

## 2019-09-24 DIAGNOSIS — M1990 Unspecified osteoarthritis, unspecified site: Secondary | ICD-10-CM | POA: Diagnosis not present

## 2019-09-24 DIAGNOSIS — E781 Pure hyperglyceridemia: Secondary | ICD-10-CM | POA: Diagnosis not present

## 2019-09-24 DIAGNOSIS — R310 Gross hematuria: Secondary | ICD-10-CM | POA: Diagnosis not present

## 2019-09-24 DIAGNOSIS — N400 Enlarged prostate without lower urinary tract symptoms: Secondary | ICD-10-CM | POA: Diagnosis not present

## 2019-09-24 DIAGNOSIS — D494 Neoplasm of unspecified behavior of bladder: Secondary | ICD-10-CM | POA: Diagnosis not present

## 2019-09-24 DIAGNOSIS — E669 Obesity, unspecified: Secondary | ICD-10-CM | POA: Diagnosis not present

## 2019-09-24 DIAGNOSIS — E785 Hyperlipidemia, unspecified: Secondary | ICD-10-CM | POA: Diagnosis not present

## 2019-09-24 DIAGNOSIS — M109 Gout, unspecified: Secondary | ICD-10-CM | POA: Diagnosis not present

## 2019-09-24 DIAGNOSIS — I10 Essential (primary) hypertension: Secondary | ICD-10-CM | POA: Diagnosis not present

## 2019-09-24 DIAGNOSIS — C670 Malignant neoplasm of trigone of bladder: Secondary | ICD-10-CM | POA: Diagnosis not present

## 2019-09-25 DIAGNOSIS — E669 Obesity, unspecified: Secondary | ICD-10-CM | POA: Diagnosis not present

## 2019-09-25 DIAGNOSIS — M1990 Unspecified osteoarthritis, unspecified site: Secondary | ICD-10-CM | POA: Diagnosis not present

## 2019-09-25 DIAGNOSIS — M109 Gout, unspecified: Secondary | ICD-10-CM | POA: Diagnosis not present

## 2019-09-25 DIAGNOSIS — D494 Neoplasm of unspecified behavior of bladder: Secondary | ICD-10-CM | POA: Diagnosis not present

## 2019-09-25 DIAGNOSIS — Z6830 Body mass index (BMI) 30.0-30.9, adult: Secondary | ICD-10-CM | POA: Diagnosis not present

## 2019-09-25 DIAGNOSIS — I10 Essential (primary) hypertension: Secondary | ICD-10-CM | POA: Diagnosis not present

## 2019-09-25 DIAGNOSIS — N400 Enlarged prostate without lower urinary tract symptoms: Secondary | ICD-10-CM | POA: Diagnosis not present

## 2019-09-25 DIAGNOSIS — C670 Malignant neoplasm of trigone of bladder: Secondary | ICD-10-CM | POA: Diagnosis not present

## 2019-09-25 DIAGNOSIS — E781 Pure hyperglyceridemia: Secondary | ICD-10-CM | POA: Diagnosis not present

## 2019-09-25 DIAGNOSIS — R310 Gross hematuria: Secondary | ICD-10-CM | POA: Diagnosis not present

## 2019-09-25 DIAGNOSIS — E785 Hyperlipidemia, unspecified: Secondary | ICD-10-CM | POA: Diagnosis not present

## 2019-10-07 ENCOUNTER — Other Ambulatory Visit: Payer: Self-pay | Admitting: Internal Medicine

## 2019-10-30 DIAGNOSIS — D494 Neoplasm of unspecified behavior of bladder: Secondary | ICD-10-CM | POA: Diagnosis not present

## 2019-10-30 DIAGNOSIS — C678 Malignant neoplasm of overlapping sites of bladder: Secondary | ICD-10-CM | POA: Diagnosis not present

## 2019-11-05 DIAGNOSIS — C678 Malignant neoplasm of overlapping sites of bladder: Secondary | ICD-10-CM | POA: Diagnosis not present

## 2019-11-12 DIAGNOSIS — C678 Malignant neoplasm of overlapping sites of bladder: Secondary | ICD-10-CM | POA: Diagnosis not present

## 2019-11-15 ENCOUNTER — Other Ambulatory Visit: Payer: Self-pay | Admitting: Internal Medicine

## 2019-11-19 DIAGNOSIS — C678 Malignant neoplasm of overlapping sites of bladder: Secondary | ICD-10-CM | POA: Diagnosis not present

## 2019-11-23 ENCOUNTER — Ambulatory Visit (INDEPENDENT_AMBULATORY_CARE_PROVIDER_SITE_OTHER): Payer: Medicare HMO

## 2019-11-23 VITALS — BP 124/74 | HR 62 | Temp 98.1°F | Ht 72.0 in | Wt 205.0 lb

## 2019-11-23 DIAGNOSIS — Z0000 Encounter for general adult medical examination without abnormal findings: Secondary | ICD-10-CM | POA: Diagnosis not present

## 2019-11-23 NOTE — Patient Instructions (Addendum)
Gerald Flores , Thank you for taking time to come for your Medicare Wellness Visit. I appreciate your ongoing commitment to your health goals. Please review the following plan we discussed and let me know if I can assist you in the future.   These are the goals we discussed: Goals      Patient Stated   .  I want to average walking 5 miles per day (pt-stated)       This is a list of the screening recommended for you and due dates:  Health Maintenance  Topic Date Due  . Pneumonia vaccines (2 of 2 - PPSV23) 09/26/2016  . Flu Shot  11/29/2019  . Cologuard (Stool DNA test)  12/02/2020  . Tetanus Vaccine  07/02/2023  . COVID-19 Vaccine  Completed  .  Hepatitis C: One time screening is recommended by Center for Disease Control  (CDC) for  adults born from [birth year] through 1966.   Completed    Immunizations Immunization History  Administered Date(s) Administered  . Fluad Quad(high Dose 65+) 04/15/2019  . PFIZER SARS-COV-2 Vaccination 06/20/2019, 07/15/2019  . Pneumococcal Conjugate-13 09/27/2015  . Tdap 07/01/2013  . Zoster Recombinat (Shingrix) 12/20/2017, 04/15/2018   Keep all routine maintenance appointments.   Patient plans to schedule 6 month labs around 02/29/20.   Advanced directives: declined  Conditions/risks identified: none new  Follow up in one year for your annual wellness visit.  Preventive Care 64 Years and Older, Male Preventive care refers to lifestyle choices and visits with your health care provider that can promote health and wellness. What does preventive care include?  A yearly physical exam. This is also called an annual well check.  Dental exams once or twice a year.  Routine eye exams. Ask your health care provider how often you should have your eyes checked.  Personal lifestyle choices, including:  Daily care of your teeth and gums.  Regular physical activity.  Eating a healthy diet.  Avoiding tobacco and drug use.  Limiting alcohol  use.  Practicing safe sex.  Taking low doses of aspirin every day.  Taking vitamin and mineral supplements as recommended by your health care provider. What happens during an annual well check? The services and screenings done by your health care provider during your annual well check will depend on your age, overall health, lifestyle risk factors, and family history of disease. Counseling  Your health care provider may ask you questions about your:  Alcohol use.  Tobacco use.  Drug use.  Emotional well-being.  Home and relationship well-being.  Sexual activity.  Eating habits.  History of falls.  Memory and ability to understand (cognition).  Work and work Statistician. Screening  You may have the following tests or measurements:  Height, weight, and BMI.  Blood pressure.  Lipid and cholesterol levels. These may be checked every 5 years, or more frequently if you are over 97 years old.  Skin check.  Lung cancer screening. You may have this screening every year starting at age 68 if you have a 30-pack-year history of smoking and currently smoke or have quit within the past 15 years.  Fecal occult blood test (FOBT) of the stool. You may have this test every year starting at age 23.  Flexible sigmoidoscopy or colonoscopy. You may have a sigmoidoscopy every 5 years or a colonoscopy every 10 years starting at age 8.  Prostate cancer screening. Recommendations will vary depending on your family history and other risks.  Hepatitis C blood test.  Hepatitis  B blood test.  Sexually transmitted disease (STD) testing.  Diabetes screening. This is done by checking your blood sugar (glucose) after you have not eaten for a while (fasting). You may have this done every 1-3 years.  Abdominal aortic aneurysm (AAA) screening. You may need this if you are a current or former smoker.  Osteoporosis. You may be screened starting at age 72 if you are at high risk. Talk with your  health care provider about your test results, treatment options, and if necessary, the need for more tests. Vaccines  Your health care provider may recommend certain vaccines, such as:  Influenza vaccine. This is recommended every year.  Tetanus, diphtheria, and acellular pertussis (Tdap, Td) vaccine. You may need a Td booster every 10 years.  Zoster vaccine. You may need this after age 46.  Pneumococcal 13-valent conjugate (PCV13) vaccine. One dose is recommended after age 47.  Pneumococcal polysaccharide (PPSV23) vaccine. One dose is recommended after age 55. Talk to your health care provider about which screenings and vaccines you need and how often you need them. This information is not intended to replace advice given to you by your health care provider. Make sure you discuss any questions you have with your health care provider. Document Released: 05/13/2015 Document Revised: 01/04/2016 Document Reviewed: 02/15/2015 Elsevier Interactive Patient Education  2017 [place] Prevention in the Home Falls can cause injuries. They can happen to people of all ages. There are many things you can do to make your home safe and to help prevent falls. What can I do on the outside of my home?  Regularly fix the edges of walkways and driveways and fix any cracks.  Remove anything that might make you trip as you walk through a door, such as a raised step or threshold.  Trim any bushes or trees on the path to your home.  Use bright outdoor lighting.  Clear any walking paths of anything that might make someone trip, such as rocks or tools.  Regularly check to see if handrails are loose or broken. Make sure that both sides of any steps have handrails.  Any raised decks and porches should have guardrails on the edges.  Have any leaves, snow, or ice cleared regularly.  Use sand or salt on walking paths during winter.  Clean up any spills in your garage right away. This includes oil  or grease spills. What can I do in the bathroom?  Use night lights.  Install grab bars by the toilet and in the tub and shower. Do not use towel bars as grab bars.  Use non-skid mats or decals in the tub or shower.  If you need to sit down in the shower, use a plastic, non-slip stool.  Keep the floor dry. Clean up any water that spills on the floor as soon as it happens.  Remove soap buildup in the tub or shower regularly.  Attach bath mats securely with double-sided non-slip rug tape.  Do not have throw rugs and other things on the floor that can make you trip. What can I do in the bedroom?  Use night lights.  Make sure that you have a light by your bed that is easy to reach.  Do not use any sheets or blankets that are too big for your bed. They should not hang down onto the floor.  Have a firm chair that has side arms. You can use this for support while you get dressed.  Do not have  throw rugs and other things on the floor that can make you trip. What can I do in the kitchen?  Clean up any spills right away.  Avoid walking on wet floors.  Keep items that you use a lot in easy-to-reach places.  If you need to reach something above you, use a strong step stool that has a grab bar.  Keep electrical cords out of the way.  Do not use floor polish or wax that makes floors slippery. If you must use wax, use non-skid floor wax.  Do not have throw rugs and other things on the floor that can make you trip. What can I do with my stairs?  Do not leave any items on the stairs.  Make sure that there are handrails on both sides of the stairs and use them. Fix handrails that are broken or loose. Make sure that handrails are as long as the stairways.  Check any carpeting to make sure that it is firmly attached to the stairs. Fix any carpet that is loose or worn.  Avoid having throw rugs at the top or bottom of the stairs. If you do have throw rugs, attach them to the floor with  carpet tape.  Make sure that you have a light switch at the top of the stairs and the bottom of the stairs. If you do not have them, ask someone to add them for you. What else can I do to help prevent falls?  Wear shoes that:  Do not have high heels.  Have rubber bottoms.  Are comfortable and fit you well.  Are closed at the toe. Do not wear sandals.  If you use a stepladder:  Make sure that it is fully opened. Do not climb a closed stepladder.  Make sure that both sides of the stepladder are locked into place.  Ask someone to hold it for you, if possible.  Clearly mark and make sure that you can see:  Any grab bars or handrails.  First and last steps.  Where the edge of each step is.  Use tools that help you move around (mobility aids) if they are needed. These include:  Canes.  Walkers.  Scooters.  Crutches.  Turn on the lights when you go into a dark area. Replace any light bulbs as soon as they burn out.  Set up your furniture so you have a clear path. Avoid moving your furniture around.  If any of your floors are uneven, fix them.  If there are any pets around you, be aware of where they are.  Review your medicines with your doctor. Some medicines can make you feel dizzy. This can increase your chance of falling. Ask your doctor what other things that you can do to help prevent falls. This information is not intended to replace advice given to you by your health care provider. Make sure you discuss any questions you have with your health care provider. Document Released: 02/10/2009 Document Revised: 09/22/2015 Document Reviewed: 05/21/2014 Elsevier Interactive Patient Education  2017 [employer].

## 2019-11-23 NOTE — Progress Notes (Addendum)
Subjective:   Gerald Flores is a 69 y.o. male who presents for Medicare Annual/Subsequent preventive examination.  Review of Systems    No ROS.  Medicare Wellness Virtual Visit.  Cardiac Risk Factors include: advanced age (>82men, >46 women);hypertension;male gender     Objective:    Today's Vitals   11/23/19 1105  BP: 124/74  Pulse: 62  Temp: 98.1 F (36.7 C)  SpO2: 98%  Weight: (!) 205 lb (93 kg)  Height: 6' (1.829 m)   Body mass index is 27.8 kg/m.  Advanced Directives 11/23/2019 11/20/2018  Does Patient Have a Medical Advance Directive? No No  Would patient like information on creating a medical advance directive? No - Patient declined Yes (MAU/Ambulatory/Procedural Areas - Information given)    Current Medications (verified) Outpatient Encounter Medications as of 11/23/2019  Medication Sig   amLODipine (NORVASC) 10 MG tablet TAKE ONE TABLET BY MOUTH DAILY   atorvastatin (LIPITOR) 40 MG tablet TAKE ONE TABLET BY MOUTH DAILY   Cholecalciferol (VITAMIN D3) 1000 units CAPS Take 1 capsule by mouth daily.   colchicine 0.6 MG tablet Take 1 tablet (0.6 mg total) by mouth 2 (two) times daily. For 7 days, as needed  for gout flare   lisinopril (ZESTRIL) 5 MG tablet Take 1 tablet (5 mg total) by mouth daily.   Multiple Vitamin (MULTIVITAMIN) tablet Take 1 tablet by mouth daily.   No facility-administered encounter medications on file as of 11/23/2019.    Allergies (verified) Patient has no known allergies.   History: Past Medical History:  Diagnosis Date   Gouty arthritis    Hypertension    Hypertriglyceridemia    Special screening for malignant neoplasm of prostate    Special screening for malignant neoplasms, colon    Transitional cell carcinoma ([place]) 1994   treated 1994, Dr. Yves Dill   History reviewed. No pertinent surgical history. Family History  Problem Relation Age of Onset   Hypertension Mother    Kidney disease Mother    Hypertension Father     Schizophrenia Brother    Social History   Socioeconomic History   Marital status: Married    Spouse name: Not on file   Number of children: Not on file   Years of education: Not on file   Highest education level: Not on file  Occupational History   Occupation: second shift supervisor  Tobacco Use   Smoking status: Former Smoker    Types: Pipe, Cigars   Smokeless tobacco: Never Used   Tobacco comment: Quit 04/2006  Vaping Use   Vaping Use: Never used  Substance and Sexual Activity   Alcohol use: No   Drug use: Not on file   Sexual activity: Not on file  Other Topics Concern   Not on file  Social History Narrative   Lives with spouse, married May, 2009   Social Determinants of Health   Financial Resource Strain: Low Risk    Difficulty of Paying Living Expenses: Not hard at all  Food Insecurity:    Worried About Charity fundraiser in the Last Year:    Arboriculturist in the Last Year:   Transportation Needs: No Data processing manager (Medical): No   Lack of Transportation (Non-Medical): No  Physical Activity:    Days of Exercise per Week:    Minutes of Exercise per Session:   Stress: No Stress Concern Present   Feeling of Stress : Not at all  Social Connections: Unknown  Frequency of Communication with Friends and Family: Not on file   Frequency of Social Gatherings with Friends and Family: Not on file   Attends Religious Services: Not on file   Active Member of Clubs or Organizations: Not on file   Attends Archivist Meetings: Not on file   Marital Status: Married    Tobacco Counseling Counseling given: Not Answered Comment: Quit 04/2006   Clinical Intake:  Pre-visit preparation completed: Yes        Diabetes: No  How often do you need to have someone help you when you read instructions, pamphlets, or other written materials from your doctor or pharmacy?: 1 - Never Interpreter Needed?: No      Activities of Daily  Living In your present state of health, do you have any difficulty performing the following activities: 11/23/2019  Hearing? N  Vision? N  Difficulty concentrating or making decisions? N  Walking or climbing stairs? N  Dressing or bathing? N  Doing errands, shopping? N  Preparing Food and eating ? N  Using the Toilet? N  In the past six months, have you accidently leaked urine? N  Do you have problems with loss of bowel control? N  Managing your Medications? N  Managing your Finances? N  Housekeeping or managing your Housekeeping? N  Some recent data might be hidden    Patient Care Team: Crecencio Mc, MD as PCP - General (Internal Medicine)  Indicate any recent Medical Services you may have received from other than [facility] providers in the past year (date may be approximate).     Assessment:   This is a routine wellness examination for Gerald Flores.  I connected with Michaelanthony today by telephone and verified that I am speaking with the correct person using two identifiers. Location patient: home Location provider: work Persons participating in the virtual visit: patient, Marine scientist.    I discussed the limitations, risks, security and privacy concerns of performing an evaluation and management service by telephone and the availability of in person appointments. I also discussed with the patient that there may be a patient responsible charge related to this service. The patient expressed understanding and verbally consented to this telephonic visit.    Interactive audio and video telecommunications were attempted between this provider and patient, however failed, due to patient having technical difficulties OR patient did not have access to video capability.  We continued and completed visit with audio only.  Some vital signs may be absent or patient reported.   Hearing/Vision screen  Hearing Screening   125Hz  250Hz  500Hz  1000Hz  2000Hz  3000Hz  4000Hz  6000Hz  8000Hz   Right ear:           Left  ear:           Comments: Patient is able to hear conversational tones without difficulty.  No issues reported.  Vision Screening Comments: Followed by [facility] Wears corrective lenses Visual acuity not assessed, virtual visit.  They have seen their ophthalmologist in the last 12 months.     Dietary issues and exercise activities discussed: Current Exercise Habits: Home exercise routine, Type of exercise: walking, Time (Minutes): 30, Frequency (Times/Week): 5, Weekly Exercise (Minutes/Week): 150, Intensity: Moderate  Goals       Patient Stated     I want to average walking 5 miles per day (pt-stated)       Depression Screen PHQ 2/9 Scores 11/23/2019 11/20/2018 02/05/2018 03/30/2016 02/05/2014  PHQ - 2 Score 0 0 0 0 0  PHQ-  9 Score - - 0 - -    Fall Risk Fall Risk  11/23/2019 09/02/2019 06/26/2019 11/20/2018 02/05/2018  Falls in the past year? 0 1 0 0 No  Number falls in past yr: 0 0 - - -  Injury with Fall? - 1 - - -  Follow up Falls evaluation completed Falls evaluation completed Falls evaluation completed - -    Handrails in use when climbing stairs? Yes  Home free of loose throw rugs in walkways, pet beds, electrical cords, etc? Yes  Adequate lighting in your home to reduce risk of falls? Yes   ASSISTIVE DEVICES UTILIZED TO PREVENT FALLS:  Life alert? No  Use of a cane, walker or w/c? No  Grab bars in the bathroom? No  Shower chair or bench in shower? No  Elevated toilet seat or a handicapped toilet? No   TIMED UP AND GO:  Was the test performed? No .    Cognitive Function:  Patient is alert and oriented x3. Denies difficulty with focusing, making decisions, memory loss.    6CIT Screen 11/20/2018  What Year? 0 points  What month? 0 points  What time? 0 points  Count back from 20 0 points  Months in reverse 0 points  Repeat phrase 0 points  Total Score 0    Immunizations Immunization History  Administered Date(s) Administered   Fluad Quad(high Dose  65+) 04/15/2019   PFIZER SARS-COV-2 Vaccination 06/20/2019, 07/15/2019   Pneumococcal Conjugate-13 09/27/2015   Tdap 07/01/2013   Zoster Recombinat (Shingrix) 12/20/2017, 04/15/2018   Health Maintenance Health Maintenance  Topic Date Due   PNA vac Low Risk Adult (2 of 2 - PPSV23) 09/26/2016   INFLUENZA VACCINE  11/29/2019   Fecal DNA (Cologuard)  12/02/2020   TETANUS/TDAP  07/02/2023   COVID-19 Vaccine  Completed   Hepatitis C Screening  Completed   Pneumococcal vaccine- Agrees to update immunization record if received from local pharmacy or health department.   Dental Screening: Recommended annual dental exams for proper oral hygiene  Community Resource Referral / Chronic Care Management: CRR required this visit?  No   CCM required this visit?  No      Plan:    Keep all routine maintenance appointments.   Patient plans to schedule 6 month labs around 02/29/20.   I have personally reviewed and noted the following in the patient's chart:   Medical and social history Use of alcohol, tobacco or illicit drugs  Current medications and supplements Functional ability and status Nutritional status Physical activity Advanced directives List of other physicians Hospitalizations, surgeries, and ER visits in previous 12 months Vitals Screenings to include cognitive, depression, and falls Referrals and appointments  In addition, I have reviewed and discussed with patient certain preventive protocols, quality metrics, and best practice recommendations. A written personalized care plan for preventive services as well as general preventive health recommendations were provided to patient via mychart.     OBrien-Blaney, Kerryn Tennant L, LPN   7/67/2094    I have reviewed the above information and agree with above.   Deborra Medina, MD

## 2019-11-26 DIAGNOSIS — C678 Malignant neoplasm of overlapping sites of bladder: Secondary | ICD-10-CM | POA: Diagnosis not present

## 2019-12-03 DIAGNOSIS — C678 Malignant neoplasm of overlapping sites of bladder: Secondary | ICD-10-CM | POA: Diagnosis not present

## 2019-12-12 ENCOUNTER — Other Ambulatory Visit: Payer: Self-pay | Admitting: Internal Medicine

## 2019-12-17 DIAGNOSIS — C678 Malignant neoplasm of overlapping sites of bladder: Secondary | ICD-10-CM | POA: Diagnosis not present

## 2019-12-24 DIAGNOSIS — C678 Malignant neoplasm of overlapping sites of bladder: Secondary | ICD-10-CM | POA: Diagnosis not present

## 2020-01-14 ENCOUNTER — Other Ambulatory Visit: Payer: Self-pay | Admitting: Internal Medicine

## 2020-01-20 ENCOUNTER — Other Ambulatory Visit: Payer: Self-pay | Admitting: Internal Medicine

## 2020-02-20 ENCOUNTER — Other Ambulatory Visit: Payer: Self-pay | Admitting: Internal Medicine

## 2020-03-23 DIAGNOSIS — C678 Malignant neoplasm of overlapping sites of bladder: Secondary | ICD-10-CM | POA: Diagnosis not present

## 2020-03-24 ENCOUNTER — Other Ambulatory Visit: Payer: Self-pay | Admitting: Internal Medicine

## 2020-04-13 DIAGNOSIS — Z20822 Contact with and (suspected) exposure to covid-19: Secondary | ICD-10-CM | POA: Diagnosis not present

## 2020-04-14 ENCOUNTER — Other Ambulatory Visit: Payer: Self-pay | Admitting: Family

## 2020-04-14 ENCOUNTER — Telehealth: Payer: Self-pay | Admitting: Family

## 2020-04-14 DIAGNOSIS — U071 COVID-19: Secondary | ICD-10-CM

## 2020-04-14 NOTE — Telephone Encounter (Signed)
04/08/20 Symptom onset [facility] 04/13/20  I connected by phone with Gerald Flores on 04/14/2020 at 10:08 AM to discuss the potential use of a new treatment for mild to moderate COVID-19 viral infection in non-hospitalized patients.  This patient is a 69 y.o. male that meets the FDA criteria for Emergency Use Authorization of COVID monoclonal antibody casirivimab/imdevimab, bamlanivimab/etesevimab, or sotrovimab.  Has a (+) direct SARS-CoV-2 viral test result  Has mild or moderate COVID-19   Is NOT hospitalized due to COVID-19  Is within 10 days of symptom onset  Has at least one of the high risk factor(s) for progression to severe COVID-19 and/or hospitalization as defined in EUA.  Specific high risk criteria : Older age (>/= 70 yo), BMI > 25 and Cardiovascular disease or hypertension   I have spoken and communicated the following to the patient or parent/caregiver regarding COVID monoclonal antibody treatment:  1. FDA has authorized the emergency use for the treatment of mild to moderate COVID-19 in adults and pediatric patients with positive results of direct SARS-CoV-2 viral testing who are 64 years of age and older weighing at least 40 kg, and who are at high risk for progressing to severe COVID-19 and/or hospitalization.  2. The significant known and potential risks and benefits of COVID monoclonal antibody, and the extent to which such potential risks and benefits are unknown.  3. Information on available alternative treatments and the risks and benefits of those alternatives, including clinical trials.  4. Patients treated with COVID monoclonal antibody should continue to self-isolate and use infection control measures (e.g., wear mask, isolate, social distance, avoid sharing personal items, clean and disinfect "high touch" surfaces, and frequent handwashing) according to CDC guidelines.   5. The patient or parent/caregiver has the option to accept or refuse  COVID monoclonal antibody treatment.  After reviewing this information with the patient, the patient has agreed to receive one of the available covid 19 monoclonal antibodies and will be provided an appropriate fact sheet prior to infusion. Loel Dubonnet, NP 04/14/2020 10:08 AM

## 2020-04-15 ENCOUNTER — Ambulatory Visit (HOSPITAL_COMMUNITY)
Admission: RE | Admit: 2020-04-15 | Discharge: 2020-04-15 | Disposition: A | Discharge status: Home / Self Care | Payer: Medicare Other | Source: Ambulatory Visit | Attending: Pulmonary Disease | Admitting: Pulmonary Disease

## 2020-04-15 DIAGNOSIS — U071 COVID-19: Secondary | ICD-10-CM | POA: Insufficient documentation

## 2020-04-15 DIAGNOSIS — Z23 Encounter for immunization: Secondary | ICD-10-CM | POA: Insufficient documentation

## 2020-04-15 MED ORDER — SODIUM CHLORIDE 0.9 % IV BOLUS: 500.0000 mL | Freq: Once | INTRAVENOUS | Status: DC

## 2020-04-15 MED ORDER — SODIUM CHLORIDE 0.9 % IV SOLN: Freq: Once | INTRAVENOUS | Status: AC

## 2020-04-15 MED ORDER — FAMOTIDINE IN NACL 20-0.9 MG/50ML-% IV SOLN: 20.0000 mg | Freq: Once | INTRAVENOUS | Status: DC | PRN

## 2020-04-15 MED ORDER — DIPHENHYDRAMINE HCL 50 MG/ML IJ SOLN: 50.0000 mg | Freq: Once | INTRAMUSCULAR | Status: DC | PRN

## 2020-04-15 MED ORDER — SODIUM CHLORIDE 0.9 % IV SOLN: INTRAVENOUS | Status: DC | PRN

## 2020-04-15 MED ORDER — METHYLPREDNISOLONE SODIUM SUCC 125 MG IJ SOLR: 125.0000 mg | Freq: Once | INTRAMUSCULAR | Status: DC | PRN

## 2020-04-15 MED ORDER — ALBUTEROL SULFATE HFA 108 (90 BASE) MCG/ACT IN AERS: 2.0000 | INHALATION_SPRAY | Freq: Once | RESPIRATORY_TRACT | Status: DC | PRN

## 2020-04-15 MED ORDER — EPINEPHRINE 0.3 MG/0.3ML IJ SOAJ: 0.3000 mg | Freq: Once | INTRAMUSCULAR | Status: DC | PRN

## 2020-04-15 NOTE — Progress Notes (Signed)
°  Diagnosis: COVID-19  Physician: Asencion Noble, MD  Procedure: [facility] Infusion [facility] Med: bamlanivimab\etesevimab infusion - Provided patient with bamlanimivab\etesevimab fact sheet for patients, parents and caregivers prior to infusion.  Complications: No immediate complications noted.  Discharge: Discharged home   Gerald Flores 04/15/2020

## 2020-04-15 NOTE — Discharge Instructions (Signed)
10 Things You Can Do to Manage Your COVID-19 Symptoms at Home If you have possible or confirmed COVID-19: 1. Stay home from work and school. And stay away from other public places. If you must go out, avoid using any kind of public transportation, ridesharing, or taxis. 2. Monitor your symptoms carefully. If your symptoms get worse, call your healthcare provider immediately. 3. Get rest and stay hydrated. 4. If you have a medical appointment, call the healthcare provider ahead of time and tell them that you have or may have COVID-19. 5. For medical emergencies, call 911 and notify the dispatch personnel that you have or may have COVID-19. 6. Cover your cough and sneezes with a tissue or use the inside of your elbow. 7. Wash your hands often with soap and water for at least 20 seconds or clean your hands with an alcohol-based hand sanitizer that contains at least 60% alcohol. 8. As much as possible, stay in a specific room and away from other people in your home. Also, you should use a separate bathroom, if available. If you need to be around other people in or outside of the home, wear a mask. 9. Avoid sharing personal items with other people in your household, like dishes, towels, and bedding. 10. Clean all surfaces that are touched often, like counters, tabletops, and doorknobs. Use household cleaning sprays or wipes according to the label instructions. cdc.gov/coronavirus 10/29/2018 This information is not intended to replace advice given to you by your health care provider. Make sure you discuss any questions you have with your health care provider. Document Revised: 04/02/2019 Document Reviewed: 04/02/2019 Elsevier Patient Education  2020 Elsevier Inc. What types of side effects do monoclonal antibody drugs cause?  Common side effects  In general, the more common side effects caused by monoclonal antibody drugs include: . Allergic reactions, such as hives or itching . Flu-like signs and  symptoms, including chills, fatigue, fever, and muscle aches and pains . Nausea, vomiting . Diarrhea . Skin rashes . Low blood pressure   The CDC is recommending patients who receive monoclonal antibody treatments wait at least 90 days before being vaccinated.  Currently, there are no data on the safety and efficacy of mRNA COVID-19 vaccines in persons who received monoclonal antibodies or convalescent plasma as part of COVID-19 treatment. Based on the estimated half-life of such therapies as well as evidence suggesting that reinfection is uncommon in the 90 days after initial infection, vaccination should be deferred for at least 90 days, as a precautionary measure until additional information becomes available, to avoid interference of the antibody treatment with vaccine-induced immune responses. If you have any questions or concerns after the infusion please call the Advanced Practice Provider on call at [phone]. This number is ONLY intended for your use regarding questions or concerns about the infusion post-treatment side-effects.  Please do not provide this number to others for use. For return to work notes please contact your primary care provider.   If someone you know is interested in receiving treatment please have them call the COVID hotline at [phone].   

## 2020-04-15 NOTE — Progress Notes (Signed)
Patient reviewed Fact Sheet for Patients, Parents, and Caregivers for Emergency Use Authorization (EUA) of bamlanivimab and etesevimab for the Treatment of Coronavirus. Patient also reviewed and is agreeable to the estimated cost of treatment. Patient is agreeable to proceed.   

## 2020-04-27 ENCOUNTER — Other Ambulatory Visit: Payer: Self-pay | Admitting: Internal Medicine

## 2020-06-26 ENCOUNTER — Other Ambulatory Visit: Payer: Self-pay | Admitting: Internal Medicine

## 2020-07-01 ENCOUNTER — Other Ambulatory Visit: Payer: Self-pay | Admitting: Internal Medicine

## 2020-07-21 ENCOUNTER — Other Ambulatory Visit: Payer: Self-pay | Admitting: Internal Medicine

## 2020-07-27 DIAGNOSIS — H2513 Age-related nuclear cataract, bilateral: Secondary | ICD-10-CM | POA: Diagnosis not present

## 2020-08-01 ENCOUNTER — Telehealth: Payer: Self-pay | Admitting: Internal Medicine

## 2020-08-01 ENCOUNTER — Other Ambulatory Visit: Payer: Self-pay | Admitting: Internal Medicine

## 2020-08-01 MED ORDER — AMLODIPINE BESYLATE 10 MG PO TABS: 1.0000 | ORAL_TABLET | Freq: Every day | ORAL | 0 refills | Status: DC

## 2020-08-01 NOTE — Telephone Encounter (Signed)
Patient needs a refill on his amLODipine (NORVASC) 10 MG tablet. Patient will schedule appt with Dr. Derrel Nip

## 2020-08-30 ENCOUNTER — Other Ambulatory Visit: Payer: Self-pay

## 2020-08-30 ENCOUNTER — Encounter: Payer: Self-pay | Admitting: Internal Medicine

## 2020-08-30 ENCOUNTER — Ambulatory Visit (INDEPENDENT_AMBULATORY_CARE_PROVIDER_SITE_OTHER): Payer: Medicare HMO | Admitting: Internal Medicine

## 2020-08-30 VITALS — BP 144/88 | HR 62 | Temp 96.8°F | Resp 15 | Ht 72.0 in | Wt 215.0 lb

## 2020-08-30 DIAGNOSIS — E781 Pure hyperglyceridemia: Secondary | ICD-10-CM | POA: Diagnosis not present

## 2020-08-30 DIAGNOSIS — C679 Malignant neoplasm of bladder, unspecified: Secondary | ICD-10-CM

## 2020-08-30 DIAGNOSIS — R200 Anesthesia of skin: Secondary | ICD-10-CM | POA: Diagnosis not present

## 2020-08-30 DIAGNOSIS — R5383 Other fatigue: Secondary | ICD-10-CM | POA: Diagnosis not present

## 2020-08-30 DIAGNOSIS — I10 Essential (primary) hypertension: Secondary | ICD-10-CM | POA: Diagnosis not present

## 2020-08-30 DIAGNOSIS — R202 Paresthesia of skin: Secondary | ICD-10-CM

## 2020-08-30 DIAGNOSIS — R7303 Prediabetes: Secondary | ICD-10-CM

## 2020-08-30 NOTE — Progress Notes (Signed)
Subjective:  Patient ID: Gerald Flores, male    DOB: March 24, 1951  Age: 70 y.o. MRN: [id number]  CC: The primary encounter diagnosis was Hypertriglyceridemia. Diagnoses of Primary hypertension, Numbness and tingling in both hands, Fatigue, unspecified type, Malignant neoplasm of urinary bladder, unspecified site [facility]), and Prediabetes were also pertinent to this visit.  HPI [name] presents for FOLLOW UP ON hypertension, hyperlipidemia and  chronic back pain due to DDD   This visit occurred during the SARS-CoV-2 public health emergency.  Safety protocols were in place, including screening questions prior to the visit, additional usage of staff PPE, and extensive cleaning of exam room while observing appropriate contact time as indicated for disinfecting solutions.   Bladder CAncer :  He underwent bladder resection Sep 25 2019.   Ten days of pain , severe, post op.  He was denied medication from Urology staff  since he declined them them at discharge from hospital .  Finished the first  6 infusions  But 2nd set has not not given bc "they were waiting on the medication ."   Lost 16 lbs  during process  Nadir was 199 .  Has regained some weight due to feeling better, inactivity due to COVID infection,  And lack of activity.    had COVID INFECTION IN December.  Was vaccinated prior to infection (out of concern for Dad) wife was unvaccinated and had a more difficult time.  HE received the moab infusion , she required oxygen   2) episodes of intermittent numbness involving all 4 fingers of both hands. Has been occurring for the past 6 months.   Numbness is intermittent:  occurs during sleep as well as during the day.  He has no history of whiplash or injury to neck.   No history of occupational overuse.  Denies significant or chronic  neck pain.   3) Fatigue. Notes that since last year he has been fatiguing more easily . in bed by 10 pm , nocturia x 2,  Up by 7 am . Gets worn out by yardwork.  Feels  better after a nap.  No snoring .  No chest pain or dyspnea, no edema  Or othopnea.   5) Hypertension: patient checks blood pressure twice weekly at home.  Readings have been for the most part < 140/80 at rest . Patient is following a reduced salt diet most days and is taking amlodipine and lisinopril  as prescribed  Outpatient Medications Prior to Visit  Medication Sig Dispense Refill  . amLODipine (NORVASC) 10 MG tablet Take 1 tablet (10 mg total) by mouth daily. 30 tablet 0  . atorvastatin (LIPITOR) 40 MG tablet TAKE ONE TABLET BY MOUTH DAILY 90 tablet 1  . Cholecalciferol (VITAMIN D3) 1000 units CAPS Take 1 capsule by mouth daily.    . colchicine 0.6 MG tablet Take 1 tablet (0.6 mg total) by mouth 2 (two) times daily. For 7 days, as needed  for gout flare 60 tablet 1  . lisinopril (ZESTRIL) 5 MG tablet TAKE ONE TABLET BY MOUTH DAILY 90 tablet 3  . Multiple Vitamin (MULTIVITAMIN) tablet Take 1 tablet by mouth daily.     No facility-administered medications prior to visit.    Review of Systems;  Patient denies headache, fevers, malaise, unintentional weight loss, skin rash, eye pain, sinus congestion and sinus pain, sore throat, dysphagia,  hemoptysis , cough, dyspnea, wheezing, chest pain, palpitations, orthopnea, edema, abdominal pain, nausea, melena, diarrhea, constipation, flank pain, dysuria, hematuria, urinary  Frequency,  seizures,  Focal weakness, Loss of consciousness,  Tremor, insomnia, depression, anxiety, and suicidal ideation.      Objective:  BP (!) 144/88 (BP Location: Left Arm, Patient Position: Sitting, Cuff Size: Large)   Pulse 62   Temp (!) 96.8 F (36 C) (Temporal)   Resp 15   Ht 6' (1.829 m)   Wt 215 lb (97.5 kg)   SpO2 99%   BMI 29.16 kg/m   BP Readings from Last 3 Encounters:  08/30/20 (!) 144/88  04/15/20 118/85  11/23/19 124/74    Wt Readings from Last 3 Encounters:  08/30/20 215 lb (97.5 kg)  11/23/19 (!) 205 lb (93 kg)  09/02/19 218 lb 3.2 oz  (99 kg)    General appearance: alert, cooperative and appears stated age Ears: normal TM's and external ear canals both ears Throat: lips, mucosa, and tongue normal; teeth and gums normal Neck: no adenopathy, no carotid bruit, supple, symmetrical, trachea midline and thyroid not enlarged, symmetric, no tenderness/mass/nodules Back: symmetric, no curvature. ROM normal. No CVA tenderness. Lungs: clear to auscultation bilaterally Heart: regular rate and rhythm, S1, S2 normal, no murmur, click, rub or gallop Abdomen: soft, non-tender; bowel sounds normal; no masses,  no organomegaly Pulses: 2+ and symmetric Skin: Skin color, texture, turgor normal. No rashes or lesions Lymph nodes: Cervical, supraclavicular, and axillary nodes normal.  Lab Results  Component Value Date   HGBA1C 5.7 08/30/2020   HGBA1C 6.2 06/17/2019   HGBA1C 5.6 11/18/2017    Lab Results  Component Value Date   CREATININE 0.90 08/30/2020   CREATININE 0.98 06/17/2019   CREATININE 0.93 05/14/2018    Lab Results  Component Value Date   WBC 5.9 08/30/2020   HGB 13.6 08/30/2020   HCT 40.3 08/30/2020   PLT 247.0 08/30/2020   GLUCOSE 85 08/30/2020   CHOL 157 08/30/2020   TRIG 182.0 (H) 08/30/2020   HDL 42.90 08/30/2020   LDLDIRECT 75.0 06/17/2019   LDLCALC 78 08/30/2020   ALT 21 08/30/2020   AST 31 08/30/2020   NA 140 08/30/2020   K 4.0 08/30/2020   CL 104 08/30/2020   CREATININE 0.90 08/30/2020   BUN 15 08/30/2020   CO2 29 08/30/2020   TSH 1.34 08/30/2020   PSA 2.20 06/17/2019   HGBA1C 5.7 08/30/2020   MICROALBUR 1.0 08/30/2020    No results found.  Assessment & Plan:   Problem List Items Addressed This Visit      Unprioritized   Prediabetes    He has lowered a1c from 6.2 to 5.7 with healthier diet and involuntary weight loss   Lab Results  Component Value Date   HGBA1C 5.7 08/30/2020         Numbness and tingling in both hands    Patient describing symptoms suggestive of neuropathy.   preliminary labs are normal. Awaiting 24 hour urine screen.  Likely due to chemotherapy received for bladder CA   Lab Results  Component Value Date   VITAMINB12 417 08/30/2020   Lab Results  Component Value Date   TSH 1.34 08/30/2020   Lab Results  Component Value Date   HGBA1C 5.7 08/30/2020         Relevant Orders   TSH (Completed)   Hemoglobin A1c (Completed)   Vitamin B12 (Completed)   Folate RBC   Heavy Metals Profile, Urine   Hypertriglyceridemia - Primary    Mild,  With LDL at goal on current dose of ATORVASTATIN.  No changes today  Lab Results  Component Value Date   CHOL 157 08/30/2020   HDL 42.90 08/30/2020   LDLCALC 78 08/30/2020   LDLDIRECT 75.0 06/17/2019   TRIG 182.0 (H) 08/30/2020   CHOLHDL 4 08/30/2020   Lab Results  Component Value Date   ALT 21 08/30/2020   AST 31 08/30/2020   ALKPHOS 57 08/30/2020   BILITOT 0.9 08/30/2020          Relevant Orders   Lipid panel (Completed)   Hypertension    he reports compliance with medication regimen  but has an elevated reading today in office.  e is not using NSAIDs daily.  Discussed goal of 120/70  (130/80 for patients over 70)  to preserve renal function.  he has been asked to check his  BP  at home and  submit readings for evaluation. Renal function, electrolytes and screen for proteinuria are all normal .  Will change lisinopril to ARB if titration is needed   Lab Results  Component Value Date   CREATININE 0.90 08/30/2020   Lab Results  Component Value Date   MICROALBUR 1.0 08/30/2020   MICROALBUR 14.0 (H) 06/17/2019     Lab Results  Component Value Date   NA 140 08/30/2020   K 4.0 08/30/2020   CL 104 08/30/2020   CO2 29 08/30/2020         Relevant Orders   Microalbumin / creatinine urine ratio (Completed)   Comprehensive metabolic panel (Completed)   Fatigue   Relevant Orders   CBC with Differential/Platelet (Completed)   Bladder cancer ([place])    Presenting with gross hematuria.   S/p TURBT in May 2021 by [facility] John with path report showing high grade noninvasive papillary urothelial CA.  He is awaiting 2nd round of chemotherapy when medication is made availabe, after completing first round of six infusions          I am having Chrissie Noa C. Rhue maintain his multivitamin, colchicine, Vitamin D3, lisinopril, atorvastatin, and amLODipine.  No orders of the defined types were placed in this encounter.   There are no discontinued medications.  Follow-up: Return in about 4 weeks (around 09/27/2020).   Crecencio Mc, MD

## 2020-08-30 NOTE — Patient Instructions (Signed)

## 2020-08-31 LAB — CBC WITH DIFFERENTIAL/PLATELET
Basophils Absolute: 0 10*3/uL (ref 0.0–0.1)
Basophils Relative: 0.8 % (ref 0.0–3.0)
Eosinophils Absolute: 0.1 10*3/uL (ref 0.0–0.7)
Eosinophils Relative: 2.4 % (ref 0.0–5.0)
HCT: 40.3 % (ref 39.0–52.0)
Hemoglobin: 13.6 g/dL (ref 13.0–17.0)
Lymphocytes Relative: 26.1 % (ref 12.0–46.0)
Lymphs Abs: 1.5 10*3/uL (ref 0.7–4.0)
MCHC: 33.8 g/dL (ref 30.0–36.0)
MCV: 91.3 fl (ref 78.0–100.0)
Monocytes Absolute: 0.6 10*3/uL (ref 0.1–1.0)
Monocytes Relative: 10.1 % (ref 3.0–12.0)
Neutro Abs: 3.6 10*3/uL (ref 1.4–7.7)
Neutrophils Relative %: 60.6 % (ref 43.0–77.0)
Platelets: 247 10*3/uL (ref 150.0–400.0)
RBC: 4.41 Mil/uL (ref 4.22–5.81)
RDW: 13.5 % (ref 11.5–15.5)
WBC: 5.9 10*3/uL (ref 4.0–10.5)

## 2020-08-31 LAB — COMPREHENSIVE METABOLIC PANEL
ALT: 21 U/L (ref 0–53)
AST: 31 U/L (ref 0–37)
Albumin: 4.2 g/dL (ref 3.5–5.2)
Alkaline Phosphatase: 57 U/L (ref 39–117)
BUN: 15 mg/dL (ref 6–23)
CO2: 29 mEq/L (ref 19–32)
Calcium: 9.2 mg/dL (ref 8.4–10.5)
Chloride: 104 mEq/L (ref 96–112)
Creatinine, Ser: 0.9 mg/dL (ref 0.40–1.50)
GFR: 86.08 mL/min (ref >60.00)
Glucose, Bld: 85 mg/dL (ref 70–99)
Potassium: 4 mEq/L (ref 3.5–5.1)
Sodium: 140 mEq/L (ref 135–145)
Total Bilirubin: 0.9 mg/dL (ref 0.2–1.2)
Total Protein: 6.8 g/dL (ref 6.0–8.3)

## 2020-08-31 LAB — HEMOGLOBIN A1C: Hgb A1c MFr Bld: 5.7 % (ref 4.6–6.5)

## 2020-08-31 LAB — LIPID PANEL
Cholesterol: 157 mg/dL (ref 0–200)
HDL: 42.9 mg/dL (ref >39.00)
LDL Cholesterol: 78 mg/dL (ref 0–99)
NonHDL: 114.38
Total CHOL/HDL Ratio: 4
Triglycerides: 182 mg/dL — ABNORMAL HIGH (ref 0.0–149.0)
VLDL: 36.4 mg/dL (ref 0.0–40.0)

## 2020-08-31 LAB — VITAMIN B12: Vitamin B-12: 417 pg/mL (ref 211–911)

## 2020-08-31 LAB — MICROALBUMIN / CREATININE URINE RATIO
Creatinine,U: 60.4 mg/dL
Microalb Creat Ratio: 1.6 mg/g (ref 0.0–30.0)
Microalb, Ur: 1 mg/dL (ref 0.0–1.9)

## 2020-08-31 LAB — TSH: TSH: 1.34 u[IU]/mL (ref 0.35–4.50)

## 2020-09-01 DIAGNOSIS — R7303 Prediabetes: Secondary | ICD-10-CM | POA: Insufficient documentation

## 2020-09-01 DIAGNOSIS — R5383 Other fatigue: Secondary | ICD-10-CM | POA: Insufficient documentation

## 2020-09-01 DIAGNOSIS — R200 Anesthesia of skin: Secondary | ICD-10-CM | POA: Insufficient documentation

## 2020-09-01 DIAGNOSIS — C679 Malignant neoplasm of bladder, unspecified: Secondary | ICD-10-CM | POA: Insufficient documentation

## 2020-09-01 DIAGNOSIS — R202 Paresthesia of skin: Secondary | ICD-10-CM | POA: Insufficient documentation

## 2020-09-01 NOTE — Assessment & Plan Note (Signed)
Presenting with gross hematuria.  S/p TURBT in May 2021 by Gerald Flores with path report showing high grade noninvasive papillary urothelial CA.  He is awaiting 2nd round of chemotherapy when medication is made availabe, after completing first round of six infusions

## 2020-09-01 NOTE — Assessment & Plan Note (Signed)
he reports compliance with medication regimen  but has an elevated reading today in office.  e is not using NSAIDs daily.  Discussed goal of 120/70  (130/80 for patients over 70)  to preserve renal function.  he has been asked to check his  BP  at home and  submit readings for evaluation. Renal function, electrolytes and screen for proteinuria are all normal .  Will change lisinopril to ARB if titration is needed   Lab Results  Component Value Date   CREATININE 0.90 08/30/2020   Lab Results  Component Value Date   MICROALBUR 1.0 08/30/2020   MICROALBUR 14.0 (H) 06/17/2019     Lab Results  Component Value Date   NA 140 08/30/2020   K 4.0 08/30/2020   CL 104 08/30/2020   CO2 29 08/30/2020

## 2020-09-01 NOTE — Assessment & Plan Note (Signed)
He has lowered a1c from 6.2 to 5.7 with healthier diet and involuntary weight loss   Lab Results  Component Value Date   HGBA1C 5.7 08/30/2020

## 2020-09-01 NOTE — Assessment & Plan Note (Signed)
Mild,  With LDL at goal on current dose of ATORVASTATIN.  No changes today  Lab Results  Component Value Date   CHOL 157 08/30/2020   HDL 42.90 08/30/2020   LDLCALC 78 08/30/2020   LDLDIRECT 75.0 06/17/2019   TRIG 182.0 (H) 08/30/2020   CHOLHDL 4 08/30/2020   Lab Results  Component Value Date   ALT 21 08/30/2020   AST 31 08/30/2020   ALKPHOS 57 08/30/2020   BILITOT 0.9 08/30/2020

## 2020-09-01 NOTE — Assessment & Plan Note (Addendum)
Patient describing symptoms suggestive of neuropathy.  preliminary labs are normal. Awaiting 24 hour urine screen.  Likely due to chemotherapy received for bladder CA   Lab Results  Component Value Date   VITAMINB12 417 08/30/2020   Lab Results  Component Value Date   TSH 1.34 08/30/2020   Lab Results  Component Value Date   HGBA1C 5.7 08/30/2020

## 2020-09-05 ENCOUNTER — Other Ambulatory Visit: Payer: Self-pay

## 2020-09-05 ENCOUNTER — Other Ambulatory Visit (INDEPENDENT_AMBULATORY_CARE_PROVIDER_SITE_OTHER): Payer: Medicare HMO

## 2020-09-05 DIAGNOSIS — R202 Paresthesia of skin: Secondary | ICD-10-CM | POA: Diagnosis not present

## 2020-09-05 DIAGNOSIS — R200 Anesthesia of skin: Secondary | ICD-10-CM

## 2020-09-09 LAB — HEAVY METALS PROFILE, URINE
Arsenic, 24H Ur: 19 mcg/L (ref <=80)
Lead, 24 hr urine: <10 mcg/L (ref <80)
Mercury, 24H Ur: <4 mcg/L (ref <=20)

## 2020-09-25 ENCOUNTER — Other Ambulatory Visit: Payer: Self-pay | Admitting: Internal Medicine

## 2020-09-29 ENCOUNTER — Ambulatory Visit: Payer: Medicare HMO | Admitting: Internal Medicine

## 2020-10-21 ENCOUNTER — Other Ambulatory Visit: Payer: Self-pay

## 2020-10-21 ENCOUNTER — Encounter: Payer: Self-pay | Admitting: Internal Medicine

## 2020-10-21 ENCOUNTER — Ambulatory Visit (INDEPENDENT_AMBULATORY_CARE_PROVIDER_SITE_OTHER): Payer: Medicare HMO | Admitting: Internal Medicine

## 2020-10-21 VITALS — BP 128/76 | HR 65 | Temp 95.8°F | Resp 15 | Ht 72.0 in | Wt 213.8 lb

## 2020-10-21 DIAGNOSIS — R7303 Prediabetes: Secondary | ICD-10-CM

## 2020-10-21 DIAGNOSIS — R5383 Other fatigue: Secondary | ICD-10-CM | POA: Diagnosis not present

## 2020-10-21 DIAGNOSIS — R202 Paresthesia of skin: Secondary | ICD-10-CM | POA: Diagnosis not present

## 2020-10-21 DIAGNOSIS — Z125 Encounter for screening for malignant neoplasm of prostate: Secondary | ICD-10-CM | POA: Diagnosis not present

## 2020-10-21 DIAGNOSIS — I10 Essential (primary) hypertension: Secondary | ICD-10-CM | POA: Diagnosis not present

## 2020-10-21 DIAGNOSIS — R200 Anesthesia of skin: Secondary | ICD-10-CM | POA: Diagnosis not present

## 2020-10-21 NOTE — Patient Instructions (Signed)
Blood pressure is at goal.  Remember to measure BP with arm at chest height (use table to support arm)  Cervical spine is likely the source of your arm/hand tingling  SUPPORT YOUR NECK WHEN SLEEPING  YOUR RISK FOR DIABETES IS DECREASING! KEEP UP THE GOOD WORK

## 2020-10-21 NOTE — Progress Notes (Signed)
Subjective:  Patient ID: Gerald Flores, male    DOB: 17-Jun-1950  Age: 70 y.o. MRN: [id number]  CC: The primary encounter diagnosis was Prostate cancer screening. Diagnoses of Fatigue, unspecified type, Primary hypertension, Numbness and tingling in both hands, and Prediabetes were also pertinent to this visit.  HPI Axavier Pressley Durney presents for follow up on hypertension   This visit occurred during the SARS-CoV-2 public health emergency.  Safety protocols were in place, including screening questions prior to the visit, additional usage of staff PPE, and extensive cleaning of exam room while observing appropriate contact time as indicated for disinfecting solutions.    HTN:  Patient is taking his medications (amlodipine and lisinopril) as prescribed and notes no adverse effects.  Home BP readings have been done regularly since his last visit and are  generally 140/80.  He is is avoiding added salt in his diet and walking regularly about 3 times per week for exercise  . home machine is measuring 20 pts higher than office readings done by staff.  Cervical radiculitis   he has developed intermittent tingling of arms/hands.  It is bilaterally,  but right greater than left. He denies loss of strength and/or dexterity in arms/hands.    Easy fatiguability: he is concerned that he can no longer work outside for 6 to 8 hours without feeling exhausted and worn out.    Prediabetes diagnosis discussed   Outpatient Medications Prior to Visit  Medication Sig Dispense Refill   amLODipine (NORVASC) 10 MG tablet TAKE ONE TABLET BY MOUTH DAILY 30 tablet 0   atorvastatin (LIPITOR) 40 MG tablet TAKE ONE TABLET BY MOUTH DAILY 90 tablet 1   Cholecalciferol (VITAMIN D3) 1000 units CAPS Take 1 capsule by mouth daily.     colchicine 0.6 MG tablet Take 1 tablet (0.6 mg total) by mouth 2 (two) times daily. For 7 days, as needed  for gout flare 60 tablet 1   lisinopril (ZESTRIL) 5 MG tablet TAKE ONE TABLET BY MOUTH DAILY  90 tablet 3   Multiple Vitamin (MULTIVITAMIN) tablet Take 1 tablet by mouth daily.     No facility-administered medications prior to visit.    Review of Systems;  Patient denies headache, fevers, malaise, unintentional weight loss, skin rash, eye pain, sinus congestion and sinus pain, sore throat, dysphagia,  hemoptysis , cough, dyspnea, wheezing, chest pain, palpitations, orthopnea, edema, abdominal pain, nausea, melena, diarrhea, constipation, flank pain, dysuria, hematuria, urinary  Frequency, nocturia, numbness, tingling, seizures,  Focal weakness, Loss of consciousness,  Tremor, insomnia, depression, anxiety, and suicidal ideation.      Objective:  BP 128/76 (BP Location: Left Arm, Patient Position: Sitting, Cuff Size: Large)   Pulse 65   Temp (!) 95.8 F (35.4 C) (Temporal)   Resp 15   Ht 6' (1.829 m)   Wt 213 lb 12.8 oz (97 kg)   SpO2 97%   BMI 29.00 kg/m   BP Readings from Last 3 Encounters:  10/21/20 128/76  08/30/20 (!) 144/88  04/15/20 118/85    Wt Readings from Last 3 Encounters:  10/21/20 213 lb 12.8 oz (97 kg)  08/30/20 215 lb (97.5 kg)  11/23/19 (!) 205 lb (93 kg)    General appearance: alert, cooperative and appears stated age Ears: normal TM's and external ear canals both ears Throat: lips, mucosa, and tongue normal; teeth and gums normal Neck: no adenopathy, no carotid bruit, supple, symmetrical, trachea midline and thyroid not enlarged, symmetric, no tenderness/mass/nodules Back: symmetric, no curvature. ROM  normal. No CVA tenderness. Lungs: clear to auscultation bilaterally Heart: regular rate and rhythm, S1, S2 normal, no murmur, click, rub or gallop Abdomen: soft, non-tender; bowel sounds normal; no masses,  no organomegaly Pulses: 2+ and symmetric Skin: Skin color, texture, turgor normal. No rashes or lesions Lymph nodes: Cervical, supraclavicular, and axillary nodes normal.  Lab Results  Component Value Date   HGBA1C 5.7 08/30/2020   HGBA1C  6.2 06/17/2019   HGBA1C 5.6 11/18/2017    Lab Results  Component Value Date   CREATININE 0.90 08/30/2020   CREATININE 0.98 06/17/2019   CREATININE 0.93 05/14/2018    Lab Results  Component Value Date   WBC 5.9 08/30/2020   HGB 13.6 08/30/2020   HCT 40.3 08/30/2020   PLT 247.0 08/30/2020   GLUCOSE 85 08/30/2020   CHOL 157 08/30/2020   TRIG 182.0 (H) 08/30/2020   HDL 42.90 08/30/2020   LDLDIRECT 75.0 06/17/2019   LDLCALC 78 08/30/2020   ALT 21 08/30/2020   AST 31 08/30/2020   NA 140 08/30/2020   K 4.0 08/30/2020   CL 104 08/30/2020   CREATININE 0.90 08/30/2020   BUN 15 08/30/2020   CO2 29 08/30/2020   TSH 1.34 08/30/2020   PSA 2.20 06/17/2019   HGBA1C 5.7 08/30/2020   MICROALBUR 1.0 08/30/2020    No results found.  Assessment & Plan:   Problem List Items Addressed This Visit       Unprioritized   Fatigue    Screening labs normal.  He denies chest pain and exertional dyspnea. Recommending regular exercise   Lab Results  Component Value Date   TSH 1.34 08/30/2020   Lab Results  Component Value Date   WBC 5.9 08/30/2020   HGB 13.6 08/30/2020   HCT 40.3 08/30/2020   MCV 91.3 08/30/2020   PLT 247.0 08/30/2020          Hypertension    Well controlled on current regimen. Home BP machine measures  20 pts higher than manual office readings.  Renal function stable, no changes today.  Lab Results  Component Value Date   CREATININE 0.90 08/30/2020   Lab Results  Component Value Date   NA 140 08/30/2020   K 4.0 08/30/2020   CL 104 08/30/2020   CO2 29 08/30/2020          Numbness and tingling in both hands    Preliminary labs and 24 hour urine screen are all normal.  symptoms are mild and likely due to chemotherapy and/or cervical disk disease.  Supportive care outlined.   Lab Results  Component Value Date   [id number] 427 08/30/2020   Lab Results  Component Value Date   TSH 1.34 08/30/2020   Lab Results  Component Value Date   HGBA1C 5.7  08/30/2020          Prediabetes    He has lowered a1c from 6.2 to 5.7 with healthier diet and involuntary weight loss .  Encouraged ot begin regular exercise program  Lab Results  Component Value Date   HGBA1C 5.7 08/30/2020          Other Visit Diagnoses     Prostate cancer screening    -  Primary   Relevant Orders   PSA, Medicare       I provided  30 minutes of  face-to-face time during this encounter reviewing patient's hypertension, prediabetes, and neuropathy , past surgeries and chemotherapy, labs and imaging studies, providing counseling on the above mentioned problems , and coordination  of care .   I am having Heaven Wandell. [place] maintain his multivitamin, colchicine, Vitamin D3, lisinopril, atorvastatin, and amLODipine.  No orders of the defined types were placed in this encounter.   There are no discontinued medications.  Follow-up: No follow-ups on file.   Crecencio Mc, MD

## 2020-10-24 NOTE — Assessment & Plan Note (Signed)
Well controlled on current regimen. Home BP machine measures  20 pts higher than manual office readings.  Renal function stable, no changes today.  Lab Results  Component Value Date   CREATININE 0.90 08/30/2020   Lab Results  Component Value Date   NA 140 08/30/2020   K 4.0 08/30/2020   CL 104 08/30/2020   CO2 29 08/30/2020

## 2020-10-24 NOTE — Assessment & Plan Note (Signed)
He has lowered a1c from 6.2 to 5.7 with healthier diet and involuntary weight loss .  Encouraged ot begin regular exercise program  Lab Results  Component Value Date   HGBA1C 5.7 08/30/2020

## 2020-10-24 NOTE — Assessment & Plan Note (Signed)
Screening labs normal.  He denies chest pain and exertional dyspnea. Recommending regular exercise   Lab Results  Component Value Date   TSH 1.34 08/30/2020   Lab Results  Component Value Date   WBC 5.9 08/30/2020   HGB 13.6 08/30/2020   HCT 40.3 08/30/2020   MCV 91.3 08/30/2020   PLT 247.0 08/30/2020

## 2020-10-24 NOTE — Assessment & Plan Note (Addendum)
Preliminary labs and 24 hour urine screen are all normal.  symptoms are mild and likely due to chemotherapy and/or cervical disk disease.  Supportive care outlined.   Lab Results  Component Value Date   [id number] 595 08/30/2020   Lab Results  Component Value Date   TSH 1.34 08/30/2020   Lab Results  Component Value Date   HGBA1C 5.7 08/30/2020

## 2020-10-31 ENCOUNTER — Other Ambulatory Visit: Payer: Self-pay | Admitting: Internal Medicine

## 2020-11-18 ENCOUNTER — Other Ambulatory Visit: Payer: Self-pay

## 2020-11-18 ENCOUNTER — Other Ambulatory Visit (INDEPENDENT_AMBULATORY_CARE_PROVIDER_SITE_OTHER): Payer: Medicare HMO

## 2020-11-18 DIAGNOSIS — Z125 Encounter for screening for malignant neoplasm of prostate: Secondary | ICD-10-CM

## 2020-11-18 DIAGNOSIS — R972 Elevated prostate specific antigen [PSA]: Secondary | ICD-10-CM

## 2020-11-18 LAB — PSA, MEDICARE: PSA: 3.2 ng/ml (ref 0.10–4.00)

## 2020-11-21 DIAGNOSIS — R972 Elevated prostate specific antigen [PSA]: Secondary | ICD-10-CM | POA: Insufficient documentation

## 2020-11-21 NOTE — Assessment & Plan Note (Signed)
Advised to follow up with Dr Valma Cava due to an increase in PSA by > 0.75 ng/ml since last year.

## 2020-11-21 NOTE — Progress Notes (Signed)
Your PSA is normal, but it has increased by more than 0.75 ng/ml in the past year.  This could indicate an abnormality , so I would like you to follow up with Dr  Valma Cava for an ultrasound if he agrees   Regards,   Deborra Medina, MD

## 2020-11-23 ENCOUNTER — Ambulatory Visit: Payer: Medicare HMO

## 2020-11-24 ENCOUNTER — Telehealth: Payer: Self-pay | Admitting: *Deleted

## 2020-11-24 DIAGNOSIS — C679 Malignant neoplasm of bladder, unspecified: Secondary | ICD-10-CM

## 2020-11-24 NOTE — Telephone Encounter (Signed)
-----   Message from Crecencio Mc, MD sent at 11/21/2020  3:30 PM EDT ----- Your PSA is normal, but it has increased by more than 0.75 ng/ml in the past year.  This could indicate an abnormality , so I would like you to follow up with Dr  Valma Cava for an ultrasound if he agrees   Regards,   Deborra Medina, MD

## 2020-11-24 NOTE — Telephone Encounter (Signed)
Second attempt to reach patient, available he is willing to have the Ultra sound and to see Dr Valma Cava. I have pended the referral.

## 2020-11-25 ENCOUNTER — Telehealth: Payer: Self-pay | Admitting: Internal Medicine

## 2020-11-25 NOTE — Telephone Encounter (Signed)
Left message for patient to call back and schedule Medicare Annual Wellness Visit (AWV) in office.   If not able to come in office, please offer to do virtually or by telephone.   Last AWV:11/23/2019  Please schedule at anytime with Nurse Health Advisor.

## 2020-11-30 DIAGNOSIS — C678 Malignant neoplasm of overlapping sites of bladder: Secondary | ICD-10-CM | POA: Diagnosis not present

## 2021-01-12 ENCOUNTER — Telehealth: Payer: Self-pay

## 2021-01-12 DIAGNOSIS — Z1211 Encounter for screening for malignant neoplasm of colon: Secondary | ICD-10-CM

## 2021-01-12 NOTE — Telephone Encounter (Signed)
Called pt to let him know that he is due for his colon cancer screening(cologuard) and see if it is okay to order. Pt said go ahead. Order has been placed.   Pt also wanted to know if he could get a bone density scan done since he has never had one before. If okay order will need to be placed to [facility] at [facility].

## 2021-01-13 NOTE — Telephone Encounter (Signed)
Spoke with pt and he stated that he thought he needed one and his wife had been fussing at him about getting one. He also stated that you are his doctor and if you don't think it is necessary then he is fine with that.

## 2021-01-14 ENCOUNTER — Other Ambulatory Visit: Payer: Self-pay | Admitting: Internal Medicine

## 2021-02-13 DIAGNOSIS — Z1211 Encounter for screening for malignant neoplasm of colon: Secondary | ICD-10-CM | POA: Diagnosis not present

## 2021-02-18 LAB — COLOGUARD: Cologuard: NEGATIVE

## 2021-04-27 ENCOUNTER — Telehealth (INDEPENDENT_AMBULATORY_CARE_PROVIDER_SITE_OTHER): Payer: Medicare HMO | Admitting: Internal Medicine

## 2021-04-27 ENCOUNTER — Encounter: Payer: Self-pay | Admitting: Internal Medicine

## 2021-04-27 VITALS — BP 109/72 | HR 76 | Temp 97.5°F | Ht 72.0 in | Wt 216.5 lb

## 2021-04-27 DIAGNOSIS — E785 Hyperlipidemia, unspecified: Secondary | ICD-10-CM | POA: Diagnosis not present

## 2021-04-27 DIAGNOSIS — Z1211 Encounter for screening for malignant neoplasm of colon: Secondary | ICD-10-CM

## 2021-04-27 DIAGNOSIS — C679 Malignant neoplasm of bladder, unspecified: Secondary | ICD-10-CM

## 2021-04-27 DIAGNOSIS — I10 Essential (primary) hypertension: Secondary | ICD-10-CM

## 2021-04-27 DIAGNOSIS — M109 Gout, unspecified: Secondary | ICD-10-CM | POA: Diagnosis not present

## 2021-04-27 DIAGNOSIS — R7303 Prediabetes: Secondary | ICD-10-CM

## 2021-04-27 DIAGNOSIS — E781 Pure hyperglyceridemia: Secondary | ICD-10-CM

## 2021-04-27 NOTE — Assessment & Plan Note (Signed)
cologuard was negative July 2022

## 2021-04-27 NOTE — Assessment & Plan Note (Signed)
Well controlled on current regimen of amlodipine and lisinopril. . Renal function stable, no changes today.

## 2021-04-27 NOTE — Progress Notes (Signed)
Virtual Visit converted to Telephone Note  This visit type was conducted due to national recommendations for restrictions regarding the COVID-19 pandemic (e.g. social distancing).  This format is felt to be most appropriate for this patient at this time.  All issues noted in this document were discussed and addressed.  No physical exam was performed (except for noted visual exam findings with Video Visits).   I connected withNAME@ on 04/27/21 at  9:00 AM EST by a video enabled telemedicine application  and verified that I am speaking with the correct person using two identifiers. Location patient: home Location provider: work or home office Persons participating in the virtual visit: patient, provider  I discussed the limitations, risks, security and privacy concerns of performing an evaluation and management service by telephone and the availability of in person appointments. I also discussed with the patient that there may be a patient responsible charge related to this service. The patient expressed understanding and agreed to proceed.  Interactive audio and video telecommunications were attempted between this provider and patient, however failed, due to patient having technical difficulties .  We continued and completed visit with audio only.   Reason for visit: follow up  HPI:  1) gout episode :  right toe,  took colchicine 0.6 mg once daily,  symptoms have resolved  2) Hypertension: patient checks blood pressure twice weekly at home.  Readings have been for  < 130/80 and as low as 115/70 on lisinopril and amlodipine... Patient is following a reduce salt diet most days and is taking medications as prescribed   3) Overweight wit prediabestes by last year's A1c of 6.2  gained 3 lbs after Thanksgiving ,  and 5 more after Christmas .Drinks a lot of ginger ale.  [place]alking [place]  [place],  [place] etc.  Discussed dietary mofidications  4) viral illness over a month ago.   Covid vs flu ,  uncomplicated,  did not get tested.  Reminded to get influenza vaccine ASAP   ROS: See pertinent positives and negatives per HPI.  Past Medical History:  Diagnosis Date   Gouty arthritis    Hypertension    Hypertriglyceridemia    Special screening for malignant neoplasm of prostate    Special screening for malignant neoplasms, colon    Transitional cell carcinoma ([place]) 1994   treated 1994, Dr. Yves Dill    No past surgical history on file.  Family History  Problem Relation Age of Onset   Hypertension Mother    Kidney disease Mother    Hypertension Father    Schizophrenia Brother     SOCIAL HX:  reports that he has quit smoking. His smoking use included pipe and cigars. He has never used smokeless tobacco. He reports that he does not drink alcohol. No history on file for drug use.    Current Outpatient Medications:    amLODipine (NORVASC) 10 MG tablet, TAKE ONE TABLET BY MOUTH DAILY, Disp: 90 tablet, Rfl: 1   atorvastatin (LIPITOR) 40 MG tablet, TAKE ONE TABLET BY MOUTH DAILY, Disp: 90 tablet, Rfl: 1   Cholecalciferol (VITAMIN D3) 1000 units CAPS, Take 1 capsule by mouth daily., Disp: , Rfl:    colchicine 0.6 MG tablet, Take 1 tablet (0.6 mg total) by mouth 2 (two) times daily. For 7 days, as needed  for gout flare, Disp: 60 tablet, Rfl: 1   lisinopril (ZESTRIL) 5 MG tablet, TAKE ONE TABLET BY MOUTH DAILY, Disp: 90 tablet, Rfl: 3  Multiple Vitamin (MULTIVITAMIN) tablet, Take 1 tablet by mouth daily., Disp: , Rfl:   EXAM:  General impression: alert, cooperative and articulate.  No signs of being in distress  Lungs: speech is fluent sentence length suggests that patient is not short of breath and not punctuated by cough, sneezing or sniffing. Marland Kitchen   Psych: affect normal.  speech is articulate and non pressured .  Denies suicidal thoughts    ASSESSMENT AND PLAN:  Discussed the following assessment and plan:  Primary hypertension - Plan: Comprehensive metabolic  panel  Hypertriglyceridemia - Plan: Lipid panel  Gouty arthritis - Plan: Uric acid  Prediabetes - Plan: Hemoglobin A1c  Screening for colon cancer  Hyperlipidemia with target LDL less than 100  Malignant neoplasm of urinary bladder, unspecified site ([place]) - Plan: Urinalysis, Routine w reflex microscopic  Gouty arthritis He recently had his first episode in 3 years.  Now resolved.  Right big toe. Now resolved with use of colchicine aat minimal dosing. ( One daily)     Hypertension Well controlled on current regimen of amlodipine and lisinopril. . Renal function stable, no changes today.  Prediabetes He has lowered a1c from 6.2 to 5.7 with healthier diet and weight loss but has gained ten lbs since last visit. .  Encouraged to begin dietary modifications including restriction of sweets and sodas. Repeat labs due    Screening for colon cancer cologuard was negative July 2022  Hyperlipidemia with target LDL less than 100 Mild,  With LDL has been at goal on current dose of ATORVASTATIN.   Lab Results  Component Value Date   CHOL 157 08/30/2020   HDL 42.90 08/30/2020   LDLCALC 78 08/30/2020   LDLDIRECT 75.0 06/17/2019   TRIG 182.0 (H) 08/30/2020   CHOLHDL 4 08/30/2020   Lab Results  Component Value Date   ALT 21 08/30/2020   AST 31 08/30/2020   ALKPHOS 57 08/30/2020   BILITOT 0.9 08/30/2020      Bladder cancer ([place]) Presented with gross hematuria.  S/p TURBT in May 2021 by Broadus John with path report showing high grade noninvasive papillary urothelial CA.  He finished his  2nd round of chemotherapy and has been reminded to continue annual follow up    I discussed the assessment and treatment plan with the patient. The patient was provided an opportunity to ask questions and all were answered. The patient agreed with the plan and demonstrated an understanding of the instructions.   The patient was advised to call back or seek an in-person evaluation if the symptoms worsen  or if the condition fails to improve as anticipated.   I spent   30 minutes dedicated to the care of this patient on the date of this encounter to include pre-visit review of his medical history,  non Face-to-face time with the patient , and post visit ordering of testing and therapeutics.    Crecencio Mc, MD

## 2021-04-27 NOTE — Assessment & Plan Note (Addendum)
He recently had his first episode in 3 years.  Now resolved.  Right big toe. Now resolved with use of colchicine aat minimal dosing. ( One daily)

## 2021-04-27 NOTE — Assessment & Plan Note (Signed)
Presented with gross hematuria.  S/p TURBT in May 2021 by Broadus John with path report showing high grade noninvasive papillary urothelial CA.  He finished his  2nd round of chemotherapy and has been reminded to continue annual follow up

## 2021-04-27 NOTE — Assessment & Plan Note (Addendum)
Mild,  With LDL has been at goal on current dose of ATORVASTATIN.   Lab Results  Component Value Date   CHOL 157 08/30/2020   HDL 42.90 08/30/2020   LDLCALC 78 08/30/2020   LDLDIRECT 75.0 06/17/2019   TRIG 182.0 (H) 08/30/2020   CHOLHDL 4 08/30/2020   Lab Results  Component Value Date   ALT 21 08/30/2020   AST 31 08/30/2020   ALKPHOS 57 08/30/2020   BILITOT 0.9 08/30/2020

## 2021-04-27 NOTE — Progress Notes (Signed)
Virtual Visit converted to Telephone Note  This visit type was conducted due to national recommendations for restrictions regarding the COVID-19 pandemic (e.g. social distancing).  This format is felt to be most appropriate for this patient at this time.  All issues noted in this document were discussed and addressed.  No physical exam was performed (except for noted visual exam findings with Video Visits).   I connected withNAME@ on 04/27/21 at  9:00 AM EST by a video enabled telemedicine application and verified that I am speaking with the correct person using two identifiers. Location patient: home Location provider: work or home office Persons participating in the virtual visit: patient, provider  I discussed the limitations, risks, security and privacy concerns of performing an evaluation and management service by telephone and the availability of in person appointments. I also discussed with the patient that there may be a patient responsible charge related to this service. The patient expressed understanding and agreed to proceed.  Interactive audio and video telecommunications were attempted between this provider and patient, however failed, due to patient having technical difficulties   We continued and completed visit with audio only.   Reason for visit: 6 month follow up on multiple issues (hypertension, hyperlipidemia, overweight, prediabetes)  HPI:  1) Hypertension: patient checks blood pressure twice weekly at home.  Readings have been for the most part < 130/80 at rest . Patient is following a reduce salt diet most days and is taking medications as prescribed  (amlodipine an lisinopril)  2) overweight/prediabetes:  weight gain of 10 lbs since Thanksgiving reported.  Dietary indulgences reviewed.  He is walking daily with his dogs for at leats 30 minutes.  Reports dyspnea but no chest tightness or chest pain.  3) hyperlipidemia; tolerating atorvastatin  4) recent episode of of  gout involving right great toe,  resolved with lw dose of colchicine  5) recent URI started with sore throat, sinus congestion.  No testing done.  Symptoms resolved.  Has not had flu vaccine this year.  Covid vaccinated       ROS: See pertinent positives and negatives per HPI.  Past Medical History:  Diagnosis Date   Gouty arthritis    Hypertension    Hypertriglyceridemia    Special screening for malignant neoplasm of prostate    Special screening for malignant neoplasms, colon    Transitional cell carcinoma ([place]) 1994   treated 1994, Dr. Yves Dill    No past surgical history on file.  Family History  Problem Relation Age of Onset   Hypertension Mother    Kidney disease Mother    Hypertension Father    Schizophrenia Brother     SOCIAL HX:  reports that he has quit smoking. His smoking use included pipe and cigars. He has never used smokeless tobacco. He reports that he does not drink alcohol. No history on file for drug use.    Current Outpatient Medications:    amLODipine (NORVASC) 10 MG tablet, TAKE ONE TABLET BY MOUTH DAILY, Disp: 90 tablet, Rfl: 1   atorvastatin (LIPITOR) 40 MG tablet, TAKE ONE TABLET BY MOUTH DAILY, Disp: 90 tablet, Rfl: 1   Cholecalciferol (VITAMIN D3) 1000 units CAPS, Take 1 capsule by mouth daily., Disp: , Rfl:    colchicine 0.6 MG tablet, Take 1 tablet (0.6 mg total) by mouth 2 (two) times daily. For 7 days, as needed  for gout flare, Disp: 60 tablet, Rfl: 1   lisinopril (ZESTRIL) 5 MG tablet, TAKE ONE TABLET BY MOUTH DAILY,  Disp: 90 tablet, Rfl: 3   Multiple Vitamin (MULTIVITAMIN) tablet, Take 1 tablet by mouth daily., Disp: , Rfl:   EXAM:   General impression: alert, cooperative and articulate.  No signs of being in distress  Lungs: speech is fluent sentence length suggests that patient is not short of breath and not punctuated by cough, sneezing or sniffing. Marland Kitchen   Psych: affect normal.  speech is articulate and non pressured .  Denies suicidal  thoughts    ASSESSMENT AND PLAN:  Discussed the following assessment and plan:  Primary hypertension - Plan: Comprehensive metabolic panel  Hypertriglyceridemia - Plan: Lipid panel  Gouty arthritis - Plan: Uric acid  Prediabetes - Plan: Hemoglobin A1c  Screening for colon cancer  Hyperlipidemia with target LDL less than 100  Malignant neoplasm of urinary bladder, unspecified site ([place]) - Plan: Urinalysis, Routine w reflex microscopic  Gouty arthritis He recently had his first episode in 3 years.  Now resolved.  Right big toe. Now resolved with use of colchicine aat minimal dosing. ( One daily)     Hypertension Well controlled on current regimen of amlodipine and lisinopril. . Renal function stable, no changes today.  Prediabetes He has lowered a1c from 6.2 to 5.7 with healthier diet and weight loss but has gained ten lbs since last visit. .  Encouraged to begin dietary modifications including restriction of sweets and sodas. Repeat labs due    Screening for colon cancer cologuard was negative July 2022  Hyperlipidemia with target LDL less than 100 Mild,  With LDL has been at goal on current dose of ATORVASTATIN.   Lab Results  Component Value Date   CHOL 157 08/30/2020   HDL 42.90 08/30/2020   LDLCALC 78 08/30/2020   LDLDIRECT 75.0 06/17/2019   TRIG 182.0 (H) 08/30/2020   CHOLHDL 4 08/30/2020   Lab Results  Component Value Date   ALT 21 08/30/2020   AST 31 08/30/2020   ALKPHOS 57 08/30/2020   BILITOT 0.9 08/30/2020      Bladder cancer ([place]) Presented with gross hematuria.  S/p TURBT in May 2021 by Broadus John with path report showing high grade noninvasive papillary urothelial CA.  He finished his  2nd round of chemotherapy and has been reminded to continue annual follow up    I discussed the assessment and treatment plan with the patient. The patient was provided an opportunity to ask questions and all were answered. The patient agreed with the plan and  demonstrated an understanding of the instructions.   The patient was advised to call back or seek an in-person evaluation if the symptoms worsen or if the condition fails to improve as anticipated.   I spent 30 minutes dedicated to the care of this patient on the date of this encounter to include pre-visit review of his medical history,  Face-to-face time with the patient , and post visit ordering of testing and therapeutics.    Crecencio Mc, MD

## 2021-04-27 NOTE — Assessment & Plan Note (Signed)
He has lowered a1c from 6.2 to 5.7 with healthier diet and weight loss but has gained ten lbs since last visit. .  Encouraged to begin dietary modifications including restriction of sweets and sodas. Repeat labs due

## 2021-04-28 ENCOUNTER — Other Ambulatory Visit: Payer: Self-pay | Admitting: Internal Medicine

## 2021-06-07 DIAGNOSIS — C678 Malignant neoplasm of overlapping sites of bladder: Secondary | ICD-10-CM | POA: Diagnosis not present

## 2021-06-07 DIAGNOSIS — Z125 Encounter for screening for malignant neoplasm of prostate: Secondary | ICD-10-CM | POA: Diagnosis not present

## 2021-06-12 ENCOUNTER — Other Ambulatory Visit (INDEPENDENT_AMBULATORY_CARE_PROVIDER_SITE_OTHER): Payer: Medicare HMO

## 2021-06-12 ENCOUNTER — Other Ambulatory Visit: Payer: Self-pay

## 2021-06-12 DIAGNOSIS — M109 Gout, unspecified: Secondary | ICD-10-CM

## 2021-06-12 DIAGNOSIS — C679 Malignant neoplasm of bladder, unspecified: Secondary | ICD-10-CM | POA: Diagnosis not present

## 2021-06-12 DIAGNOSIS — E781 Pure hyperglyceridemia: Secondary | ICD-10-CM | POA: Diagnosis not present

## 2021-06-12 DIAGNOSIS — R7303 Prediabetes: Secondary | ICD-10-CM | POA: Diagnosis not present

## 2021-06-12 DIAGNOSIS — R200 Anesthesia of skin: Secondary | ICD-10-CM | POA: Diagnosis not present

## 2021-06-12 DIAGNOSIS — I10 Essential (primary) hypertension: Secondary | ICD-10-CM

## 2021-06-12 DIAGNOSIS — R202 Paresthesia of skin: Secondary | ICD-10-CM | POA: Diagnosis not present

## 2021-06-12 LAB — URINALYSIS, ROUTINE W REFLEX MICROSCOPIC
Bilirubin Urine: NEGATIVE
Ketones, ur: NEGATIVE
Leukocytes,Ua: NEGATIVE
Nitrite: NEGATIVE
Specific Gravity, Urine: 1.015 (ref 1.000–1.030)
Total Protein, Urine: NEGATIVE
Urine Glucose: NEGATIVE
Urobilinogen, UA: 0.2 (ref 0.0–1.0)
pH: 6.5 (ref 5.0–8.0)

## 2021-06-12 LAB — HEMOGLOBIN A1C: Hgb A1c MFr Bld: 5.7 % (ref 4.6–6.5)

## 2021-06-13 LAB — COMPREHENSIVE METABOLIC PANEL
ALT: 19 U/L (ref 0–53)
AST: 24 U/L (ref 0–37)
Albumin: 4.2 g/dL (ref 3.5–5.2)
Alkaline Phosphatase: 58 U/L (ref 39–117)
BUN: 16 mg/dL (ref 6–23)
CO2: 32 mEq/L (ref 19–32)
Calcium: 9.2 mg/dL (ref 8.4–10.5)
Chloride: 104 mEq/L (ref 96–112)
Creatinine, Ser: 0.99 mg/dL (ref 0.40–1.50)
GFR: 76.35 mL/min (ref >60.00)
Glucose, Bld: 95 mg/dL (ref 70–99)
Potassium: 4.4 mEq/L (ref 3.5–5.1)
Sodium: 139 mEq/L (ref 135–145)
Total Bilirubin: 0.7 mg/dL (ref 0.2–1.2)
Total Protein: 6.8 g/dL (ref 6.0–8.3)

## 2021-06-13 LAB — LIPID PANEL
Cholesterol: 159 mg/dL (ref 0–200)
HDL: 44.2 mg/dL (ref >39.00)
LDL Cholesterol: 80 mg/dL (ref 0–99)
NonHDL: 114.44
Total CHOL/HDL Ratio: 4
Triglycerides: 170 mg/dL — ABNORMAL HIGH (ref 0.0–149.0)
VLDL: 34 mg/dL (ref 0.0–40.0)

## 2021-06-13 LAB — URIC ACID: Uric Acid, Serum: 7.7 mg/dL (ref 4.0–7.8)

## 2021-06-14 ENCOUNTER — Encounter: Payer: Self-pay | Admitting: Internal Medicine

## 2021-06-14 LAB — FOLATE RBC: RBC Folate: 689 ng/mL RBC (ref >280)

## 2021-06-14 NOTE — Assessment & Plan Note (Addendum)
Microscopic hematuria noted on recent UA (7-10 RBCs/field) .  Patient advised to follow up with Dr Broadus John

## 2021-07-15 ENCOUNTER — Other Ambulatory Visit: Payer: Self-pay | Admitting: Internal Medicine

## 2021-07-18 ENCOUNTER — Other Ambulatory Visit: Payer: Self-pay | Admitting: Internal Medicine

## 2021-08-02 DIAGNOSIS — H2513 Age-related nuclear cataract, bilateral: Secondary | ICD-10-CM | POA: Diagnosis not present

## 2021-09-08 ENCOUNTER — Ambulatory Visit (INDEPENDENT_AMBULATORY_CARE_PROVIDER_SITE_OTHER): Payer: Medicare HMO

## 2021-09-08 VITALS — BP 138/84 | HR 62 | Temp 98.4°F | Ht 72.0 in | Wt 214.0 lb

## 2021-09-08 DIAGNOSIS — Z0000 Encounter for general adult medical examination without abnormal findings: Secondary | ICD-10-CM | POA: Diagnosis not present

## 2021-09-08 NOTE — Progress Notes (Addendum)
Subjective:   Gerald Flores is a 71 y.o. male who presents for Medicare Annual/Subsequent preventive examination.  Review of Systems    No ROS.  Medicare Wellness Virtual Visit.  Visual/audio telehealth visit, UTA vital signs.   See social history for additional risk factors.   Cardiac Risk Factors include: advanced age (>23men, >24 women);male gender     Objective:    Today's Vitals   09/08/21 0817  BP: 138/84  Pulse: 62  Temp: 98.4 F (36.9 C)  SpO2: 97%  Weight: 214 lb (97.1 kg)  Height: 6' (1.829 m)   Body mass index is 29.02 kg/m.     09/08/2021    8:26 AM 11/23/2019   11:15 AM 11/20/2018   11:13 AM  Advanced Directives  Does Patient Have a Medical Advance Directive? No No No  Would patient like information on creating a medical advance directive? No - Patient declined No - Patient declined Yes (MAU/Ambulatory/Procedural Areas - Information given)    Current Medications (verified) Outpatient Encounter Medications as of 09/08/2021  Medication Sig   amLODipine (NORVASC) 10 MG tablet TAKE ONE TABLET BY MOUTH DAILY   atorvastatin (LIPITOR) 40 MG tablet TAKE ONE TABLET BY MOUTH DAILY   Cholecalciferol (VITAMIN D3) 1000 units CAPS Take 1 capsule by mouth daily.   colchicine 0.6 MG tablet Take 1 tablet (0.6 mg total) by mouth 2 (two) times daily. For 7 days, as needed  for gout flare   lisinopril (ZESTRIL) 5 MG tablet TAKE ONE TABLET BY MOUTH DAILY.   Multiple Vitamin (MULTIVITAMIN) tablet Take 1 tablet by mouth daily.   No facility-administered encounter medications on file as of 09/08/2021.    Allergies (verified) Patient has no known allergies.   History: Past Medical History:  Diagnosis Date   Gouty arthritis    Hypertension    Hypertriglyceridemia    Special screening for malignant neoplasm of prostate    Special screening for malignant neoplasms, colon    Transitional cell carcinoma (HCC) 1994   treated 1994, Dr. Evelene Croon   History reviewed. No pertinent  surgical history. Family History  Problem Relation Age of Onset   Hypertension Mother    Kidney disease Mother    Hypertension Father    Hyperlipidemia Sister    Hypertension Sister    Schizophrenia Brother    Hyperlipidemia Brother    Hypertension Brother    Social History   Socioeconomic History   Marital status: Married    Spouse name: Not on file   Number of children: Not on file   Years of education: Not on file   Highest education level: Not on file  Occupational History   Occupation: second shift supervisor  Tobacco Use   Smoking status: Former    Types: Pipe, Cigars   Smokeless tobacco: Never   Tobacco comments:    Quit 04/2006  Vaping Use   Vaping Use: Never used  Substance and Sexual Activity   Alcohol use: No   Drug use: Not on file   Sexual activity: Not on file  Other Topics Concern   Not on file  Social History Narrative   Lives with spouse, married May, 2009   Social Determinants of Health   Financial Resource Strain: Low Risk    Difficulty of Paying Living Expenses: Not hard at all  Food Insecurity: No Food Insecurity   Worried About Programme researcher, broadcasting/film/video in the Last Year: Never true   [employer] of Food in the Last Year: Never  true  Transportation Needs: No Transportation Needs   Lack of Transportation (Medical): No   Lack of Transportation (Non-Medical): No  Physical Activity: Sufficiently Active   Days of Exercise per Week: 5 days   Minutes of Exercise per Session: 30 min  Stress: No Stress Concern Present   Feeling of Stress : Not at all  Social Connections: Unknown   Frequency of Communication with Friends and Family: Not on file   Frequency of Social Gatherings with Friends and Family: Not on file   Attends Religious Services: Not on file   Active Member of Clubs or Organizations: Not on file   Attends Banker Meetings: Not on file   Marital Status: Married    Tobacco Counseling Counseling given: Not Answered Tobacco  comments: Quit 04/2006   Clinical Intake:  Pre-visit preparation completed: Yes        Diabetes: No  How often do you need to have someone help you when you read instructions, pamphlets, or other written materials from your doctor or pharmacy?: 1 - Never   Interpreter Needed?: No      Activities of Daily Living    09/08/2021    8:19 AM  In your present state of health, do you have any difficulty performing the following activities:  Hearing? 0  Vision? 0  Difficulty concentrating or making decisions? 0  Walking or climbing stairs? 0  Dressing or bathing? 0  Doing errands, shopping? 0  Preparing Food and eating ? N  Using the Toilet? N  In the past six months, have you accidently leaked urine? N  Comment Followed by Urology every 3 months  Do you have problems with loss of bowel control? N  Managing your Medications? N  Managing your Finances? N  Housekeeping or managing your Housekeeping? N    Patient Care Team: Sherlene Shams, MD as PCP - General (Internal Medicine)  Indicate any recent Medical Services you may have received from other than [facility] providers in the past year (date may be approximate).     Assessment:   This is a routine wellness examination for Gerald Flores.  Virtual Visit via Telephone Note  I connected with  Gerald Flores on 09/08/21 at  8:15 AM EDT by telephone and verified that I am speaking with the correct person using two identifiers.  Persons participating in the virtual visit: patient/Nurse Health Advisor   I discussed the limitations of performing an evaluation and management service by telehealth. We continued and completed visit with audio only. Some vital signs may be absent or patient reported.   Hearing/Vision screen Hearing Screening - Comments:: Patient is able to hear conversational tones without difficulty.  No issues reported. Vision Screening - Comments:: Followed by [facility]  Wears corrective lenses They have  seen their ophthalmologist in the last 12 months.   Dietary issues and exercise activities discussed: Current Exercise Habits: Home exercise routine, Type of exercise: walking, Intensity: Mild Regular diet Good water intake   Goals Addressed               This Visit's Progress     Patient Stated     I want to average walking 5 miles per day (pt-stated)   On track      Depression Screen    09/08/2021    8:25 AM 04/27/2021    9:00 AM 10/21/2020    2:48 PM 08/30/2020    2:08 PM 11/23/2019   11:11 AM 11/20/2018   11:16  AM 02/05/2018    5:16 PM  PHQ 2/9 Scores  PHQ - 2 Score 0 0 0 0 0 0 0  PHQ- 9 Score   1 1   0    Fall Risk    09/08/2021    8:18 AM 04/27/2021    9:00 AM 10/21/2020    2:07 PM 08/30/2020    2:08 PM 11/23/2019   11:15 AM  Fall Risk   Falls in the past year? 0 0 0 0 0  Number falls in past yr:     0  Risk for fall due to :  No Fall Risks     Follow up Falls evaluation completed Falls evaluation completed Falls evaluation completed Falls evaluation completed Falls evaluation completed    FALL RISK PREVENTION PERTAINING TO THE HOME: Home free of loose throw rugs in walkways, pet beds, electrical cords, etc? Yes  Adequate lighting in your home to reduce risk of falls? Yes   ASSISTIVE DEVICES UTILIZED TO PREVENT FALLS: Life alert? No  Use of a cane, walker or w/c? No   TIMED UP AND GO: Was the test performed? No .   Cognitive Function:  Patient is alert and oriented x3.       11/20/2018   11:22 AM  6CIT Screen  What Year? 0 points  What month? 0 points  What time? 0 points  Count back from 20 0 points  Months in reverse 0 points  Repeat phrase 0 points  Total Score 0 points    Immunizations Immunization History  Administered Date(s) Administered   Fluad Quad(high Dose 65+) 04/15/2019   PFIZER(Purple Top)SARS-COV-2 Vaccination 06/20/2019, 07/15/2019   Pneumococcal Conjugate-13 09/27/2015   Tdap 07/01/2013   Zoster Recombinat (Shingrix)  12/20/2017, 04/15/2018   Pneumococcal vaccine status: Due, Education has been provided regarding the importance of this vaccine. Advised may receive this vaccine at local pharmacy or Health Dept. Aware to provide a copy of the vaccination record if obtained from local pharmacy or Health Dept. Verbalized acceptance and understanding.  Screening Tests Health Maintenance  Topic Date Due   COVID-19 Vaccine (3 - Pfizer risk series) 09/24/2021 (Originally 08/12/2019)   Pneumonia Vaccine 50+ Years old (2 - PPSV23 if available, else PCV20) 09/28/2021 (Originally 09/26/2016)   INFLUENZA VACCINE  11/28/2021   TETANUS/TDAP  07/02/2023   Fecal DNA (Cologuard)  02/14/2024   Hepatitis C Screening  Completed   Zoster Vaccines- Shingrix  Completed   HPV VACCINES  Aged Out   Health Maintenance There are no preventive care reminders to display for this patient.  Lung Cancer Screening: (Low Dose CT Chest recommended if Age 27-80 years, 30 pack-year currently smoking OR have quit w/in 15years.) does not qualify.   Vision Screening: Recommended annual ophthalmology exams for early detection of glaucoma and other disorders of the eye.  Dental Screening: Recommended annual dental exams for proper oral hygiene  Community Resource Referral / Chronic Care Management: CRR required this visit?  No   CCM required this visit?  No      Plan:     I have personally reviewed and noted the following in the patient's chart:   Medical and social history Use of alcohol, tobacco or illicit drugs  Current medications and supplements including opioid prescriptions. Patient is not currently taking opioid prescriptions. Functional ability and status Nutritional status Physical activity Advanced directives List of other physicians Hospitalizations, surgeries, and ER visits in previous 12 months Vitals Screenings to include cognitive, depression, and falls Referrals  and appointments  In addition, I have reviewed  and discussed with patient certain preventive protocols, quality metrics, and best practice recommendations. A written personalized care plan for preventive services as well as general preventive health recommendations were provided to patient.     Flores, Gerald Napoli L, LPN   1/61/0960     I have reviewed the above information and agree with above.   Duncan Dull, MD

## 2021-09-08 NOTE — Patient Instructions (Addendum)
?  Mr. Hair , ?Thank you for taking time to come for your Medicare Wellness Visit. I appreciate your ongoing commitment to your health goals. Please review the following plan we discussed and let me know if I can assist you in the future.  ? ?These are the goals we discussed: ? Goals   ? ?  ? Patient Stated  ?   I want to average walking 5 miles per day (pt-stated)   ? ?  ?  ?This is a list of the screening recommended for you and due dates:  ?Health Maintenance  ?Topic Date Due  ? COVID-19 Vaccine (3 - Pfizer risk series) 09/24/2021*  ? Pneumonia Vaccine (2 - PPSV23 if available, else PCV20) 09/28/2021*  ? Flu Shot  11/28/2021  ? Tetanus Vaccine  07/02/2023  ? Cologuard (Stool DNA test)  02/14/2024  ? Hepatitis C Screening: USPSTF Recommendation to screen - Ages 3-79 yo.  Completed  ? Zoster (Shingles) Vaccine  Completed  ? HPV Vaccine  Aged Out  ?*Topic was postponed. The date shown is not the original due date.  ?  ?

## 2021-09-21 ENCOUNTER — Telehealth: Payer: Self-pay

## 2021-09-21 NOTE — Telephone Encounter (Signed)
LMTCB. Need to let pt know that he is due for his 6 month follow up. Please schedule when he calls back.

## 2021-12-11 ENCOUNTER — Telehealth: Payer: Self-pay | Admitting: Internal Medicine

## 2021-12-11 MED ORDER — AMLODIPINE BESYLATE 10 MG PO TABS: 10.0000 mg | ORAL_TABLET | Freq: Every day | ORAL | 1 refills | Status: DC

## 2021-12-11 NOTE — Telephone Encounter (Signed)
Medication has been refilled.

## 2021-12-11 NOTE — Telephone Encounter (Signed)
Pt need refill on amlopdipine sent to [facility]

## 2022-01-03 DIAGNOSIS — C678 Malignant neoplasm of overlapping sites of bladder: Secondary | ICD-10-CM | POA: Diagnosis not present

## 2022-02-19 ENCOUNTER — Other Ambulatory Visit: Payer: Self-pay | Admitting: Family

## 2022-03-05 DIAGNOSIS — I10 Essential (primary) hypertension: Secondary | ICD-10-CM | POA: Diagnosis not present

## 2022-03-05 DIAGNOSIS — E785 Hyperlipidemia, unspecified: Secondary | ICD-10-CM | POA: Diagnosis not present

## 2022-03-05 DIAGNOSIS — Z8249 Family history of ischemic heart disease and other diseases of the circulatory system: Secondary | ICD-10-CM | POA: Diagnosis not present

## 2022-03-05 DIAGNOSIS — Z87891 Personal history of nicotine dependence: Secondary | ICD-10-CM | POA: Diagnosis not present

## 2022-03-05 DIAGNOSIS — Z8551 Personal history of malignant neoplasm of bladder: Secondary | ICD-10-CM | POA: Diagnosis not present

## 2022-03-07 ENCOUNTER — Encounter: Payer: Self-pay | Admitting: Internal Medicine

## 2022-03-07 ENCOUNTER — Ambulatory Visit (INDEPENDENT_AMBULATORY_CARE_PROVIDER_SITE_OTHER): Payer: Medicare HMO | Admitting: Internal Medicine

## 2022-03-07 VITALS — BP 136/78 | HR 67 | Temp 97.6°F | Ht 72.0 in | Wt 215.2 lb

## 2022-03-07 DIAGNOSIS — R202 Paresthesia of skin: Secondary | ICD-10-CM | POA: Diagnosis not present

## 2022-03-07 DIAGNOSIS — R7301 Impaired fasting glucose: Secondary | ICD-10-CM | POA: Diagnosis not present

## 2022-03-07 DIAGNOSIS — I10 Essential (primary) hypertension: Secondary | ICD-10-CM

## 2022-03-07 DIAGNOSIS — E785 Hyperlipidemia, unspecified: Secondary | ICD-10-CM | POA: Diagnosis not present

## 2022-03-07 DIAGNOSIS — R079 Chest pain, unspecified: Secondary | ICD-10-CM | POA: Insufficient documentation

## 2022-03-07 DIAGNOSIS — R200 Anesthesia of skin: Secondary | ICD-10-CM

## 2022-03-07 DIAGNOSIS — Z125 Encounter for screening for malignant neoplasm of prostate: Secondary | ICD-10-CM | POA: Diagnosis not present

## 2022-03-07 DIAGNOSIS — Z79899 Other long term (current) drug therapy: Secondary | ICD-10-CM

## 2022-03-07 MED ORDER — LOSARTAN POTASSIUM 100 MG PO TABS: 100.0000 mg | ORAL_TABLET | Freq: Every day | ORAL | 0 refills | Status: DC

## 2022-03-07 MED ORDER — AMLODIPINE BESYLATE 5 MG PO TABS: 5.0000 mg | ORAL_TABLET | Freq: Every day | ORAL | 0 refills | Status: DC

## 2022-03-07 NOTE — Progress Notes (Signed)
Subjective:  Patient ID: Gerald Flores, male    DOB: [birth year]  Age: 71 y.o. MRN: [id number]  CC: The primary encounter diagnosis was Primary hypertension. Diagnoses of Hyperlipidemia with target LDL less than 100, Encounter for long-term (current) use of high-risk medication, Impaired fasting glucose, Screening for prostate cancer, Chest pain, unspecified type, and Numbness and tingling in both hands were also pertinent to this visit.   HPI Gerald Flores presents for  Chief Complaint  Patient presents with   Follow-up    Follow up on hypertension   1) HTN:  taking BP at home ,   readings have been < 130/80 most of the time .  Taking amlodipine 10 mg  lisinopril 5 mg .  Getting 1 + ittig edema .   2) bladder CA:  seeing [name] every 6 months for cystoscopy  completed the infusions, was scheduled for 3 maintenance infussions but  medication was unavailable so he has to have cystocopy every 6 months   3) cc:  daytime fatigue.  Waking up naturally at 5 am.  Feels refreshed but  starts feeling drowsy by noon if he works outside .  Denies dyspnea  but has recently had chest pain with exertion ,with yardwork felt sharp and stabbing ,  lasted a few minutes.  No other symptoms/ .  Also having claudication symptoms .  Cardiology referral accepted.  [name]   4) bilateral hand numbness,  left more than right . Episodic,  occurs at night with supine position and with sitting .  Neurology referral accepted. To [name]   5)    wife reports decreased memory  Outpatient Medications Prior to Visit  Medication Sig Dispense Refill   atorvastatin (LIPITOR) 40 MG tablet TAKE ONE TABLET BY MOUTH DAILY 90 tablet 1   Cholecalciferol (VITAMIN D3) 1000 units CAPS Take 1 capsule by mouth daily.     colchicine 0.6 MG tablet Take 1 tablet (0.6 mg total) by mouth 2 (two) times daily. For 7 days, as needed  for gout flare 60 tablet 1   Multiple Vitamin (MULTIVITAMIN) tablet Take 1 tablet by mouth daily.      amLODipine (NORVASC) 10 MG tablet Take 1 tablet (10 mg total) by mouth daily. 90 tablet 1   lisinopril (ZESTRIL) 5 MG tablet TAKE ONE TABLET BY MOUTH DAILY. 90 tablet 3   No facility-administered medications prior to visit.    Review of Systems;  Patient denies headache, fevers, malaise, unintentional weight loss, skin rash, eye pain, sinus congestion and sinus pain, sore throat, dysphagia,  hemoptysis , cough, dyspnea, wheezing, chest pain, palpitations, orthopnea, edema, abdominal pain, nausea, melena, diarrhea, constipation, flank pain, dysuria, hematuria, urinary  Frequency, nocturia, numbness, tingling, seizures,  Focal weakness, Loss of consciousness,  Tremor, insomnia, depression, anxiety, and suicidal ideation.      Objective:  BP 136/78 (BP Location: Right Arm, Patient Position: Sitting, Cuff Size: Large)   Pulse 67   Temp 97.6 F (36.4 C) (Oral)   Ht 6' (1.829 m)   Wt 215 lb 3.2 oz (97.6 kg)   SpO2 97%   BMI 29.19 kg/m   BP Readings from Last 3 Encounters:  03/07/22 136/78  09/08/21 138/84  04/27/21 109/72    Wt Readings from Last 3 Encounters:  03/07/22 215 lb 3.2 oz (97.6 kg)  09/08/21 214 lb (97.1 kg)  04/27/21 216 lb 8 oz (98.2 kg)    General appearance: alert, cooperative and appears stated age Ears: normal TM's and  external ear canals both ears Throat: lips, mucosa, and tongue normal; teeth and gums normal Neck: no adenopathy, no carotid bruit, supple, symmetrical, trachea midline and thyroid not enlarged, symmetric, no tenderness/mass/nodules Back: symmetric, no curvature. ROM normal. No CVA tenderness. Lungs: clear to auscultation bilaterally Heart: regular rate and rhythm, S1, S2 normal, no murmur, click, rub or gallop Abdomen: soft, non-tender; bowel sounds normal; no masses,  no organomegaly Pulses: 2+ and symmetric Skin: Skin color, texture, turgor normal. No rashes or lesions Lymph nodes: Cervical, supraclavicular, and axillary nodes normal. Neuro:   awake and interactive with normal mood and affect. Higher cortical functions are normal. Speech is clear without word-finding difficulty or dysarthria. Extraocular movements are intact. Visual fields of both eyes are grossly intact. Sensation to light touch is grossly intact bilaterally of upper and lower extremities. Motor examination shows 4+/5 symmetric hand grip and upper extremity and 5/5 lower extremity strength. There is no pronation or drift. Gait is non-ataxic   Lab Results  Component Value Date   HGBA1C 5.7 06/12/2021   HGBA1C 5.7 08/30/2020   HGBA1C 6.2 06/17/2019    Lab Results  Component Value Date   CREATININE 0.99 06/12/2021   CREATININE 0.90 08/30/2020   CREATININE 0.98 06/17/2019    Lab Results  Component Value Date   WBC 5.9 08/30/2020   HGB 13.6 08/30/2020   HCT 40.3 08/30/2020   PLT 247.0 08/30/2020   GLUCOSE 95 06/12/2021   CHOL 159 06/12/2021   TRIG 170.0 (H) 06/12/2021   HDL 44.20 06/12/2021   LDLDIRECT 75.0 06/17/2019   LDLCALC 80 06/12/2021   ALT 19 06/12/2021   AST 24 06/12/2021   NA 139 06/12/2021   K 4.4 06/12/2021   CL 104 06/12/2021   CREATININE 0.99 06/12/2021   BUN 16 06/12/2021   CO2 32 06/12/2021   TSH 1.34 08/30/2020   PSA 3.20 11/18/2020   HGBA1C 5.7 06/12/2021   MICROALBUR 1.0 08/30/2020    No results found.  Assessment & Plan:   Problem List Items Addressed This Visit     Screening for prostate cancer   Relevant Orders   PSA, Medicare ( [place] only)   Numbness and tingling in both hands    Unclear if his symptoms are due to CTS or cervical spinal stenosis.  Referring to [name] for evaluation with EMG /nerve conduction studies.  Plain cervical films ordered       Relevant Orders   Ambulatory referral to Neurology   DG Cervical Spine Complete   Hypertension - Primary    Changing meds due to development of edema with amlodipine 10 mg dose.  Reduce to 5 mg daily.  Add losartan 100 mg daily and stop lisinopril.       Relevant Medications   losartan (COZAAR) 100 MG tablet   amLODipine (NORVASC) 5 MG tablet   Other Relevant Orders   Comp Met (CMET)   Urine Microalbumin w/creat. ratio   Hyperlipidemia with target LDL less than 100   Relevant Medications   losartan (COZAAR) 100 MG tablet   amLODipine (NORVASC) 5 MG tablet   Other Relevant Orders   Lipid Profile   Direct LDL   Encounter for long-term (current) use of high-risk medication   Relevant Orders   CBC with Differential/Platelet   TSH   Chest pain    He has multiple risk factors for CAD and no prior evaluation or risk stratification.  Given his new onset exertional pain and evidence of Q waves in  inferior leads,  he will be referred to Cardiology and will return for a chest x ray.  Considered starting a  beta blocker but given his resting pulse of 67 this was not done/       Relevant Orders   EKG 12-Lead (Completed)   Ambulatory referral to Cardiology   DG Chest 2 View   Other Visit Diagnoses     Impaired fasting glucose       Relevant Orders   Comp Met (CMET)   HgB A1c       I spent a total of  40  minutes with this patient in a face to face visit on the date of this encounter reviewing the last office visit with me , his   most recent visit with urology ,  patient's diet and exercise habits, home blood pressure readings, recent ER visit including labs and imaging studies ,  and post visit ordering of testing and therapeutics.    Follow-up: Return in about 4 weeks (around 04/04/2022).   Crecencio Mc, MD

## 2022-03-07 NOTE — Assessment & Plan Note (Signed)
Changing meds due to development of edema with amlodipine 10 mg dose.  Reduce to 5 mg daily.  Add losartan 100 mg daily and stop lisinopril.

## 2022-03-07 NOTE — Assessment & Plan Note (Addendum)
He has multiple risk factors for CAD and no prior evaluation or risk stratification.  I have ordered and reviewed a 12 lead EKG and find that there are no acute changes and patient is in sinus rhythm; however , he has q waves in inferior leads. Given his new onset exertional pain and evidence of prior infarct, he will be referred to Cardiology and will return for a chest x ray.  Considered starting a  beta blocker but given his resting pulse of 67 this was not done.

## 2022-03-07 NOTE — Assessment & Plan Note (Signed)
Unclear if his symptoms are due to CTS or cervical spinal stenosis.  Referring to [name] for evaluation with EMG /nerve conduction studies.  Plain cervical films ordered

## 2022-03-07 NOTE — Patient Instructions (Addendum)
1) stop the lisinopril  2) reduce the amlodipine to 5 mg daily (you can break tablets in 1/2)  3) adding losartan 100 mg daily for BP control  4) referrals to Dr Saralyn Pilar and [name] in progress   Return for discussion of other problems we couldn't address today

## 2022-03-08 LAB — CBC WITH DIFFERENTIAL/PLATELET
Basophils Absolute: 0 10*3/uL (ref 0.0–0.1)
Basophils Relative: 0.3 % (ref 0.0–3.0)
Eosinophils Absolute: 0.2 10*3/uL (ref 0.0–0.7)
Eosinophils Relative: 2.9 % (ref 0.0–5.0)
HCT: 40.7 % (ref 39.0–52.0)
Hemoglobin: 13.7 g/dL (ref 13.0–17.0)
Lymphocytes Relative: 27.4 % (ref 12.0–46.0)
Lymphs Abs: 1.8 10*3/uL (ref 0.7–4.0)
MCHC: 33.7 g/dL (ref 30.0–36.0)
MCV: 91.3 fl (ref 78.0–100.0)
Monocytes Absolute: 0.7 10*3/uL (ref 0.1–1.0)
Monocytes Relative: 9.8 % (ref 3.0–12.0)
Neutro Abs: 4 10*3/uL (ref 1.4–7.7)
Neutrophils Relative %: 59.6 % (ref 43.0–77.0)
Platelets: 254 10*3/uL (ref 150.0–400.0)
RBC: 4.46 Mil/uL (ref 4.22–5.81)
RDW: 13.3 % (ref 11.5–15.5)
WBC: 6.7 10*3/uL (ref 4.0–10.5)

## 2022-03-08 LAB — COMPREHENSIVE METABOLIC PANEL
ALT: 20 U/L (ref 0–53)
AST: 28 U/L (ref 0–37)
Albumin: 4.3 g/dL (ref 3.5–5.2)
Alkaline Phosphatase: 60 U/L (ref 39–117)
BUN: 17 mg/dL (ref 6–23)
CO2: 29 mEq/L (ref 19–32)
Calcium: 9.1 mg/dL (ref 8.4–10.5)
Chloride: 102 mEq/L (ref 96–112)
Creatinine, Ser: 0.92 mg/dL (ref 0.40–1.50)
GFR: 82.95 mL/min (ref >60.00)
Glucose, Bld: 83 mg/dL (ref 70–99)
Potassium: 4.4 mEq/L (ref 3.5–5.1)
Sodium: 137 mEq/L (ref 135–145)
Total Bilirubin: 1 mg/dL (ref 0.2–1.2)
Total Protein: 6.9 g/dL (ref 6.0–8.3)

## 2022-03-08 LAB — MICROALBUMIN / CREATININE URINE RATIO
Creatinine,U: 113.1 mg/dL
Microalb Creat Ratio: 1.2 mg/g (ref 0.0–30.0)
Microalb, Ur: 1.4 mg/dL (ref 0.0–1.9)

## 2022-03-08 LAB — LIPID PANEL
Cholesterol: 170 mg/dL (ref 0–200)
HDL: 48.5 mg/dL (ref >39.00)
LDL Cholesterol: 87 mg/dL (ref 0–99)
NonHDL: 121.3
Total CHOL/HDL Ratio: 4
Triglycerides: 173 mg/dL — ABNORMAL HIGH (ref 0.0–149.0)
VLDL: 34.6 mg/dL (ref 0.0–40.0)

## 2022-03-08 LAB — HEMOGLOBIN A1C: Hgb A1c MFr Bld: 5.9 % (ref 4.6–6.5)

## 2022-03-08 LAB — PSA, MEDICARE: PSA: 1.53 ng/ml (ref 0.10–4.00)

## 2022-03-08 LAB — LDL CHOLESTEROL, DIRECT: Direct LDL: 98 mg/dL

## 2022-03-08 LAB — TSH: TSH: 1.6 u[IU]/mL (ref 0.35–5.50)

## 2022-03-14 ENCOUNTER — Ambulatory Visit (INDEPENDENT_AMBULATORY_CARE_PROVIDER_SITE_OTHER): Payer: Medicare HMO

## 2022-03-14 ENCOUNTER — Other Ambulatory Visit: Payer: Medicare HMO

## 2022-03-14 DIAGNOSIS — R202 Paresthesia of skin: Secondary | ICD-10-CM | POA: Diagnosis not present

## 2022-03-14 DIAGNOSIS — R200 Anesthesia of skin: Secondary | ICD-10-CM | POA: Diagnosis not present

## 2022-03-14 DIAGNOSIS — R079 Chest pain, unspecified: Secondary | ICD-10-CM

## 2022-03-14 DIAGNOSIS — M542 Cervicalgia: Secondary | ICD-10-CM | POA: Diagnosis not present

## 2022-04-05 ENCOUNTER — Encounter: Payer: Self-pay | Admitting: Internal Medicine

## 2022-04-05 ENCOUNTER — Ambulatory Visit (INDEPENDENT_AMBULATORY_CARE_PROVIDER_SITE_OTHER): Payer: Medicare HMO | Admitting: Internal Medicine

## 2022-04-05 VITALS — BP 128/78 | HR 72 | Temp 97.5°F | Wt 216.8 lb

## 2022-04-05 DIAGNOSIS — S61459A Open bite of unspecified hand, initial encounter: Secondary | ICD-10-CM | POA: Diagnosis not present

## 2022-04-05 DIAGNOSIS — T162XXA Foreign body in left ear, initial encounter: Secondary | ICD-10-CM

## 2022-04-05 DIAGNOSIS — Z23 Encounter for immunization: Secondary | ICD-10-CM

## 2022-04-05 DIAGNOSIS — C679 Malignant neoplasm of bladder, unspecified: Secondary | ICD-10-CM

## 2022-04-05 DIAGNOSIS — I10 Essential (primary) hypertension: Secondary | ICD-10-CM

## 2022-04-05 DIAGNOSIS — R079 Chest pain, unspecified: Secondary | ICD-10-CM | POA: Diagnosis not present

## 2022-04-05 HISTORY — DX: Open bite of unspecified hand, initial encounter: S61.459A

## 2022-04-05 HISTORY — DX: Foreign body in left ear, initial encounter: T16.2XXA

## 2022-04-05 NOTE — Assessment & Plan Note (Signed)
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  It was removed with a curette today without complication

## 2022-04-05 NOTE — Assessment & Plan Note (Signed)
Improved control and edema resolved with lower dose of amlodipine and change from lisinopril to losartan . No changes today .  Will consider beta blocker if CT chest reveals aneurysm or 3 vessel disease

## 2022-04-05 NOTE — Assessment & Plan Note (Signed)
He is under the care of Dr Broadus John and seeing him every 6 months.

## 2022-04-05 NOTE — Progress Notes (Signed)
Subjective:  Patient ID: Gerald Flores, male    DOB: [birth year]  Age: 71 y.o. MRN: [id number]  CC: The primary encounter diagnosis was Chest pain, unspecified type. Diagnoses of Animal bite of hand, initial encounter, Malignant neoplasm of urinary bladder, unspecified site [facility]), Foreign body in ear, left, initial encounter, and Primary hypertension were also pertinent to this visit.   HPI Gerald Flores presents for follow up on hypertension and other issues.  Chief Complaint  Patient presents with   Follow-up   1) HTN:  seen in November.  Changed  meds due to development of edema with amlodipine at the 10 mg dose. Reduced to 5 mg daily. Added losartan 100 mg daily and advised to stop lisinopril.   2) referred to cardiology for an episode of stabbing exertional chest pain  that lasted several minutes, until he rested   3) referred to neurology also at last visit  for bilateral hand numbness suggestive of CTS.  No neck pain   4) he sustained rooster bite to left thenar eminence  yesterday.   Reviewed his last tetanus  vaccine,  which was over 8 years ago.   5) decreased hearing left ear  wears hearing aid,  but recently developed significant change /decrease.   Outpatient Medications Prior to Visit  Medication Sig Dispense Refill   amLODipine (NORVASC) 5 MG tablet Take 1 tablet (5 mg total) by mouth daily. 90 tablet 0   atorvastatin (LIPITOR) 40 MG tablet TAKE ONE TABLET BY MOUTH DAILY 90 tablet 1   Cholecalciferol (VITAMIN D3) 1000 units CAPS Take 1 capsule by mouth daily.     colchicine 0.6 MG tablet Take 1 tablet (0.6 mg total) by mouth 2 (two) times daily. For 7 days, as needed  for gout flare 60 tablet 1   losartan (COZAAR) 100 MG tablet Take 1 tablet (100 mg total) by mouth daily. 90 tablet 0   Multiple Vitamin (MULTIVITAMIN) tablet Take 1 tablet by mouth daily.     No facility-administered medications prior to visit.    Review of Systems;  Patient denies headache, fevers,  malaise, unintentional weight loss, skin rash, eye pain, sinus congestion and sinus pain, sore throat, dysphagia,  hemoptysis , cough, dyspnea, wheezing, , palpitations, orthopnea, edema, abdominal pain, nausea, melena, diarrhea, constipation, flank pain, dysuria, hematuria, urinary  Frequency, nocturia, numbness, tingling, seizures,  Focal weakness, Loss of consciousness,  Tremor, insomnia, depression, anxiety, and suicidal ideation.      Objective:  BP 128/78   Pulse 72   Temp (!) 97.5 F (36.4 C) (Oral)   Wt 216 lb 12.8 oz (98.3 kg)   SpO2 96%   BMI 29.40 kg/m   BP Readings from Last 3 Encounters:  04/05/22 128/78  03/07/22 136/78  09/08/21 138/84    Wt Readings from Last 3 Encounters:  04/05/22 216 lb 12.8 oz (98.3 kg)  03/07/22 215 lb 3.2 oz (97.6 kg)  09/08/21 214 lb (97.1 kg)    General appearance: alert, cooperative and appears stated age Ears: there is a  circular hollow foreign body embedded in left ear canal, suggestive of  hearing aid tip.  Both TM's are  scarred  but not red  roat: lips, mucosa, and tongue normal; teeth and gums normal Neck: no adenopathy, no carotid bruit, supple, symmetrical, trachea midline and thyroid not enlarged, symmetric, no tenderness/mass/nodules Back: symmetric, no curvature. ROM normal. No CVA tenderness. Lungs: clear to auscultation bilaterally Heart: regular rate and rhythm, S1, S2 normal, no  murmur, click, rub or gallop Abdomen: soft, non-tender; bowel sounds normal; no masses,  no organomegaly Pulses: 2+ and symmetric Skin: Skin color, texture, turgor normal. No rashes or lesions Lymph nodes: Cervical, supraclavicular, and axillary nodes normal. Neuro:  awake and interactive with normal mood and affect. Higher cortical functions are normal. Speech is clear without word-finding difficulty or dysarthria. Extraocular movements are intact. Visual fields of both eyes are grossly intact. Sensation to light touch is grossly intact  bilaterally of upper and lower extremities. Motor examination shows 4+/5 symmetric hand grip and upper extremity and 5/5 lower extremity strength. There is no pronation or drift. Gait is non-ataxic   Lab Results  Component Value Date   HGBA1C 5.9 03/07/2022   HGBA1C 5.7 06/12/2021   HGBA1C 5.7 08/30/2020    Lab Results  Component Value Date   CREATININE 0.92 03/07/2022   CREATININE 0.99 06/12/2021   CREATININE 0.90 08/30/2020    Lab Results  Component Value Date   WBC 6.7 03/07/2022   HGB 13.7 03/07/2022   HCT 40.7 03/07/2022   PLT 254.0 03/07/2022   GLUCOSE 83 03/07/2022   CHOL 170 03/07/2022   TRIG 173.0 (H) 03/07/2022   HDL 48.50 03/07/2022   LDLDIRECT 98.0 03/07/2022   LDLCALC 87 03/07/2022   ALT 20 03/07/2022   AST 28 03/07/2022   NA 137 03/07/2022   K 4.4 03/07/2022   CL 102 03/07/2022   CREATININE 0.92 03/07/2022   BUN 17 03/07/2022   CO2 29 03/07/2022   TSH 1.60 03/07/2022   PSA 1.53 03/07/2022   HGBA1C 5.9 03/07/2022   MICROALBUR 1.4 03/07/2022    No results found.  Assessment & Plan:   Problem List Items Addressed This Visit     Hypertension    Improved control and edema resolved with lower dose of amlodipine and change from lisinopril to losartan . No changes today .  Will consider beta blocker if CT chest reveals aneurysm or 3 vessel disease      Foreign body in ear, left, initial encounter    The rubber end of his hearing ai had become lodged in his ear canal and was causing decreased hearing.  It was removed with a curette today without complication      Chest pain - Primary    His episode was sharp ,  mid sternal, occurred during exercise and lasted several minutes.  Cardiology referral is pending; however I am ordering CT chest to  rule out aortic aneurysm given his history of hypertension and tobacco abuse.       Relevant Orders   CT Chest W Contrast   Bladder cancer [facility])    He is under the care of Dr Broadus John and seeing him every 6  months.       Animal bite of hand, initial encounter    He was bitten by his wife's rooster this week .  We will update his Tetanus vaccine as it has been 8 years since his last one .      Relevant Orders   Td : Tetanus/diphtheria >7yo Preservative  free (Completed)    I spent a total of  28  minutes with this patient in a face to face visit on the date of this encounter reviewing the last office visit with me , most recent visit with urology, home blood pressure readings, recent labs and imaging studies, and post visit ordering of testing and therapeutics.    Follow-up: No follow-ups on file.   Helene Kelp  Ether Griffins, MD

## 2022-04-05 NOTE — Assessment & Plan Note (Signed)
He was bitten by his wife's rooster this week .  We will update his Tetanus vaccine as it has been 8 years since his last one .

## 2022-04-05 NOTE — Assessment & Plan Note (Signed)
His episode was sharp ,  mid sternal, occurred during exercise and lasted several minutes.  Cardiology referral is pending; however I am ordering CT chest to  rule out aortic aneurysm given his history of hypertension and tobacco abuse.

## 2022-04-12 ENCOUNTER — Ambulatory Visit
Admission: RE | Admit: 2022-04-12 | Discharge: 2022-04-12 | Disposition: A | Discharge status: Home / Self Care | Payer: Medicare HMO | Source: Ambulatory Visit | Attending: Internal Medicine | Admitting: Internal Medicine

## 2022-04-12 DIAGNOSIS — R079 Chest pain, unspecified: Secondary | ICD-10-CM | POA: Diagnosis not present

## 2022-04-12 DIAGNOSIS — J9811 Atelectasis: Secondary | ICD-10-CM | POA: Diagnosis not present

## 2022-04-12 MED ORDER — IOHEXOL 300 MG/ML  SOLN
75.0000 mL | Freq: Once | INTRAMUSCULAR | Status: AC | PRN
  Administered 2022-04-12: 75 mL via INTRAVENOUS

## 2022-04-14 ENCOUNTER — Other Ambulatory Visit: Payer: Self-pay | Admitting: Internal Medicine

## 2022-04-14 MED ORDER — NITROGLYCERIN 0.4 MG SL SUBL: 0.4000 mg | SUBLINGUAL_TABLET | SUBLINGUAL | 3 refills | Status: DC | PRN

## 2022-04-14 NOTE — Assessment & Plan Note (Signed)
3 vessel disease suggested by CT chest.  No aortic aneurysm.  SL NTG rx'd.  Cardiology eval Dec 27 w/ [name]

## 2022-04-25 DIAGNOSIS — R9431 Abnormal electrocardiogram [ECG] [EKG]: Secondary | ICD-10-CM | POA: Diagnosis not present

## 2022-04-25 DIAGNOSIS — R079 Chest pain, unspecified: Secondary | ICD-10-CM | POA: Diagnosis not present

## 2022-04-25 DIAGNOSIS — R0602 Shortness of breath: Secondary | ICD-10-CM | POA: Diagnosis not present

## 2022-04-25 DIAGNOSIS — E781 Pure hyperglyceridemia: Secondary | ICD-10-CM | POA: Diagnosis not present

## 2022-04-25 DIAGNOSIS — I10 Essential (primary) hypertension: Secondary | ICD-10-CM | POA: Diagnosis not present

## 2022-04-26 ENCOUNTER — Other Ambulatory Visit: Payer: Self-pay

## 2022-04-26 MED ORDER — ATORVASTATIN CALCIUM 40 MG PO TABS: 40.0000 mg | ORAL_TABLET | Freq: Every day | ORAL | 3 refills | Status: DC

## 2022-05-10 DIAGNOSIS — R079 Chest pain, unspecified: Secondary | ICD-10-CM | POA: Diagnosis not present

## 2022-05-10 DIAGNOSIS — R519 Headache, unspecified: Secondary | ICD-10-CM | POA: Diagnosis not present

## 2022-05-10 DIAGNOSIS — M79602 Pain in left arm: Secondary | ICD-10-CM | POA: Diagnosis not present

## 2022-05-10 DIAGNOSIS — R202 Paresthesia of skin: Secondary | ICD-10-CM | POA: Diagnosis not present

## 2022-05-10 DIAGNOSIS — D494 Neoplasm of unspecified behavior of bladder: Secondary | ICD-10-CM | POA: Diagnosis not present

## 2022-05-10 DIAGNOSIS — M720 Palmar fascial fibromatosis [Dupuytren]: Secondary | ICD-10-CM | POA: Diagnosis not present

## 2022-05-10 DIAGNOSIS — G3184 Mild cognitive impairment, so stated: Secondary | ICD-10-CM | POA: Diagnosis not present

## 2022-05-10 DIAGNOSIS — M79601 Pain in right arm: Secondary | ICD-10-CM | POA: Diagnosis not present

## 2022-05-15 ENCOUNTER — Other Ambulatory Visit (HOSPITAL_COMMUNITY): Payer: Self-pay | Admitting: Neurology

## 2022-05-15 DIAGNOSIS — R519 Headache, unspecified: Secondary | ICD-10-CM

## 2022-05-15 DIAGNOSIS — G3184 Mild cognitive impairment, so stated: Secondary | ICD-10-CM

## 2022-05-15 DIAGNOSIS — M79601 Pain in right arm: Secondary | ICD-10-CM

## 2022-05-16 DIAGNOSIS — R0602 Shortness of breath: Secondary | ICD-10-CM | POA: Diagnosis not present

## 2022-05-16 DIAGNOSIS — R9431 Abnormal electrocardiogram [ECG] [EKG]: Secondary | ICD-10-CM | POA: Diagnosis not present

## 2022-05-16 DIAGNOSIS — R0789 Other chest pain: Secondary | ICD-10-CM | POA: Diagnosis not present

## 2022-05-28 DIAGNOSIS — I10 Essential (primary) hypertension: Secondary | ICD-10-CM | POA: Diagnosis not present

## 2022-05-28 DIAGNOSIS — R0602 Shortness of breath: Secondary | ICD-10-CM | POA: Diagnosis not present

## 2022-05-28 DIAGNOSIS — Z23 Encounter for immunization: Secondary | ICD-10-CM | POA: Diagnosis not present

## 2022-05-28 DIAGNOSIS — R9431 Abnormal electrocardiogram [ECG] [EKG]: Secondary | ICD-10-CM | POA: Diagnosis not present

## 2022-05-28 DIAGNOSIS — R079 Chest pain, unspecified: Secondary | ICD-10-CM | POA: Diagnosis not present

## 2022-05-30 ENCOUNTER — Ambulatory Visit (HOSPITAL_COMMUNITY)
Admission: RE | Admit: 2022-05-30 | Discharge: 2022-05-30 | Disposition: A | Discharge status: Home / Self Care | Payer: Medicare HMO | Source: Ambulatory Visit | Attending: Neurology | Admitting: Neurology

## 2022-05-30 DIAGNOSIS — G3184 Mild cognitive impairment, so stated: Secondary | ICD-10-CM | POA: Diagnosis not present

## 2022-05-30 DIAGNOSIS — M79601 Pain in right arm: Secondary | ICD-10-CM

## 2022-05-30 DIAGNOSIS — R202 Paresthesia of skin: Secondary | ICD-10-CM | POA: Diagnosis not present

## 2022-05-30 DIAGNOSIS — R519 Headache, unspecified: Secondary | ICD-10-CM

## 2022-05-30 DIAGNOSIS — M79602 Pain in left arm: Secondary | ICD-10-CM | POA: Insufficient documentation

## 2022-05-30 DIAGNOSIS — R899 Unspecified abnormal finding in specimens from other organs, systems and tissues: Secondary | ICD-10-CM | POA: Diagnosis not present

## 2022-06-04 ENCOUNTER — Other Ambulatory Visit: Payer: Self-pay | Admitting: Internal Medicine

## 2022-06-06 ENCOUNTER — Other Ambulatory Visit: Payer: Self-pay | Admitting: Internal Medicine

## 2022-06-26 DIAGNOSIS — R200 Anesthesia of skin: Secondary | ICD-10-CM | POA: Diagnosis not present

## 2022-07-04 DIAGNOSIS — C678 Malignant neoplasm of overlapping sites of bladder: Secondary | ICD-10-CM | POA: Diagnosis not present

## 2022-07-09 ENCOUNTER — Ambulatory Visit (INDEPENDENT_AMBULATORY_CARE_PROVIDER_SITE_OTHER): Payer: Medicare HMO | Admitting: Internal Medicine

## 2022-07-09 ENCOUNTER — Encounter: Payer: Self-pay | Admitting: Internal Medicine

## 2022-07-09 VITALS — BP 144/88 | HR 69 | Temp 97.7°F | Ht 72.0 in | Wt 220.8 lb

## 2022-07-09 DIAGNOSIS — G5603 Carpal tunnel syndrome, bilateral upper limbs: Secondary | ICD-10-CM

## 2022-07-09 DIAGNOSIS — R0789 Other chest pain: Secondary | ICD-10-CM

## 2022-07-09 DIAGNOSIS — I10 Essential (primary) hypertension: Secondary | ICD-10-CM

## 2022-07-09 DIAGNOSIS — R200 Anesthesia of skin: Secondary | ICD-10-CM

## 2022-07-09 DIAGNOSIS — R202 Paresthesia of skin: Secondary | ICD-10-CM | POA: Diagnosis not present

## 2022-07-09 DIAGNOSIS — G3184 Mild cognitive impairment, so stated: Secondary | ICD-10-CM

## 2022-07-09 LAB — COMPREHENSIVE METABOLIC PANEL
ALT: 19 U/L (ref 0–53)
AST: 23 U/L (ref 0–37)
Albumin: 3.9 g/dL (ref 3.5–5.2)
Alkaline Phosphatase: 67 U/L (ref 39–117)
BUN: 15 mg/dL (ref 6–23)
CO2: 30 mEq/L (ref 19–32)
Calcium: 9.5 mg/dL (ref 8.4–10.5)
Chloride: 102 mEq/L (ref 96–112)
Creatinine, Ser: 0.85 mg/dL (ref 0.40–1.50)
GFR: 86.44 mL/min (ref >60.00)
Glucose, Bld: 90 mg/dL (ref 70–99)
Potassium: 4.3 mEq/L (ref 3.5–5.1)
Sodium: 139 mEq/L (ref 135–145)
Total Bilirubin: 0.9 mg/dL (ref 0.2–1.2)
Total Protein: 6.8 g/dL (ref 6.0–8.3)

## 2022-07-09 MED ORDER — AMLODIPINE BESYLATE 10 MG PO TABS: 10.0000 mg | ORAL_TABLET | Freq: Every day | ORAL | 1 refills | Status: DC

## 2022-07-09 MED ORDER — LOSARTAN POTASSIUM 100 MG PO TABS: 100.0000 mg | ORAL_TABLET | Freq: Every day | ORAL | 0 refills | Status: DC

## 2022-07-09 NOTE — Assessment & Plan Note (Signed)
Secondary to stage 3 CTS per Manuella Ghazi.

## 2022-07-09 NOTE — Progress Notes (Unsigned)
Subjective:  Patient ID: AKIF WOJNO, male    DOB: 1951-03-13  Age: 72 y.o. MRN: [id number]  CC: There were no encounter diagnoses.   HPI Kennie Garino Kelso presents for  Chief Complaint  Patient presents with   Medical Management of Chronic Issues    Follow up     1) exertional chest pain:  mr Akers was referred to cardiology after CT chest noted 3 vessel disease and dilated ascending Thoracic aorta at 4.4 cm.  ETT Myoview  was normal , (no chest pain with walking on treadmill,  but was eventually  limited by right hip pain and ECHO done :  aortic measurement 3.8 cm .  Advised to follow up in 4 months (cardiac cath deferred).    2) numbness and tingling of both hands: sent o neurology EMG/[place] studies and brain MRI done .  Brain MRI noted chronic SVI changes in the white matter.  3) HTN:  home readings still 130/85 .  Reviewed goal of 120/70 to 130/80 .  Advised to increase amlodipine to 10 mg   Outpatient Medications Prior to Visit  Medication Sig Dispense Refill   amLODipine (NORVASC) 5 MG tablet TAKE 1 TABLET (5 MG TOTAL) BY MOUTH DAILY. 90 tablet 0   atorvastatin (LIPITOR) 40 MG tablet Take 1 tablet (40 mg total) by mouth daily. 90 tablet 3   Cholecalciferol (VITAMIN D3) 1000 units CAPS Take 1 capsule by mouth daily.     colchicine 0.6 MG tablet Take 1 tablet (0.6 mg total) by mouth 2 (two) times daily. For 7 days, as needed  for gout flare 60 tablet 1   escitalopram (LEXAPRO) 5 MG tablet Take 5 mg by mouth daily.     losartan (COZAAR) 100 MG tablet TAKE 1 TABLET BY MOUTH EVERY DAY 90 tablet 0   Multiple Vitamin (MULTIVITAMIN) tablet Take 1 tablet by mouth daily.     nitroGLYCERIN (NITROSTAT) 0.4 MG SL tablet Place 1 tablet (0.4 mg total) under the tongue every 5 (five) minutes as needed for chest pain. Max dose 3 tablets 20 tablet 3   No facility-administered medications prior to visit.    Review of Systems;  Patient denies headache, fevers, malaise, unintentional weight loss,  skin rash, eye pain, sinus congestion and sinus pain, sore throat, dysphagia,  hemoptysis , cough, dyspnea, wheezing, chest pain, palpitations, orthopnea, edema, abdominal pain, nausea, melena, diarrhea, constipation, flank pain, dysuria, hematuria, urinary  Frequency, nocturia, numbness, tingling, seizures,  Focal weakness, Loss of consciousness,  Tremor, insomnia, depression, anxiety, and suicidal ideation.      Objective:  BP (!) 144/88   Pulse 69   Temp 97.7 F (36.5 C) (Oral)   Ht 6' (1.829 m)   Wt 220 lb 12.8 oz (100.2 kg)   SpO2 96%   BMI 29.95 kg/m   BP Readings from Last 3 Encounters:  07/09/22 (!) 144/88  04/05/22 128/78  03/07/22 136/78    Wt Readings from Last 3 Encounters:  07/09/22 220 lb 12.8 oz (100.2 kg)  04/05/22 216 lb 12.8 oz (98.3 kg)  03/07/22 215 lb 3.2 oz (97.6 kg)    Physical Exam  Lab Results  Component Value Date   HGBA1C 5.9 03/07/2022   HGBA1C 5.7 06/12/2021   HGBA1C 5.7 08/30/2020    Lab Results  Component Value Date   CREATININE 0.92 03/07/2022   CREATININE 0.99 06/12/2021   CREATININE 0.90 08/30/2020    Lab Results  Component Value Date   WBC 6.7 03/07/2022  HGB 13.7 03/07/2022   HCT 40.7 03/07/2022   PLT 254.0 03/07/2022   GLUCOSE 83 03/07/2022   CHOL 170 03/07/2022   TRIG 173.0 (H) 03/07/2022   HDL 48.50 03/07/2022   LDLDIRECT 98.0 03/07/2022   LDLCALC 87 03/07/2022   ALT 20 03/07/2022   AST 28 03/07/2022   NA 137 03/07/2022   K 4.4 03/07/2022   CL 102 03/07/2022   CREATININE 0.92 03/07/2022   BUN 17 03/07/2022   CO2 29 03/07/2022   TSH 1.60 03/07/2022   PSA 1.53 03/07/2022   HGBA1C 5.9 03/07/2022   MICROALBUR 1.4 03/07/2022    MR BRAIN WO CONTRAST  Result Date: 05/30/2022 CLINICAL DATA:  Provided history: Mild cognitive impairment. Headache disorder. Paresthesia and pain of both upper extremities. EXAM: MRI HEAD WITHOUT CONTRAST TECHNIQUE: Multiplanar, multiecho pulse sequences of the brain and surrounding  structures were obtained without intravenous contrast. Additionally, using NeuroQuant software a 3D volumetric analysis of the brain was performed and is compared to a normative database adjusted for age, gender and intracranial volume. COMPARISON:  No pertinent prior exams available for comparison. FINDINGS: Brain: Subjectively, no age advanced or lobar predominant cerebral atrophy is appreciated. Multifocal T2 FLAIR hyperintense signal abnormality within the cerebral white matter, nonspecific but compatible with mild chronic small vessel ischemic disease. There is no acute infarct. No evidence of an intracranial mass. No extra-axial fluid collection. No midline shift. Vascular: Maintained flow voids within the proximal large arterial vessels. Skull and upper cervical spine: No focal suspicious marrow lesion. Sinuses/Orbits: No mass or acute finding within the imaged orbits. Trace mucosal thickening within the inferior left maxillary sinus. NeuroQuant Findings: Volumetric analysis of the brain was performed, with a fully detailed report in [facility]. Briefly, the comparison with age and gender matched reference reveals a whole brain volume normative percentile of 60. IMPRESSION: 1. No evidence of acute intracranial abnormality. 2. Mild chronic small vessel ischemic changes within the cerebral white matter. 3. NeuroQuant volumetric analysis of the brain, see details on [employer]. Briefly, the comparison with age and gender matched reference reveals a whole brain volume normative percentile of 50. Electronically Signed   By: Kellie Simmering D.O.   On: 05/30/2022 18:49    Assessment & Plan:  .There are no diagnoses linked to this encounter.   I provided 30 minutes of face-to-face time during this encounter reviewing patient's last visit with me, patient's  most recent visit with cardiology,  nephrology,  and neurology,  recent surgical and non surgical procedures, previous  labs and imaging studies, counseling  on currently addressed issues,  and post visit ordering to diagnostics and therapeutics .   Follow-up: No follow-ups on file.   Crecencio Mc, MD

## 2022-07-09 NOTE — Patient Instructions (Addendum)
Resume the higher dose of amlodipine,  10 mg  daily and continue losartan 100 mg daily  for blood pressure management   Goal is 130/80 or less   Get your father's appointment  moved up. From May to late march .  He should stay on the omeprazole until he sees me    Tell him  he is shutting his kidneys down by taking too much of the fluid pill , the last lab by Dr Vita Barley noted this

## 2022-07-10 ENCOUNTER — Encounter: Payer: Self-pay | Admitting: Internal Medicine

## 2022-07-10 DIAGNOSIS — G3184 Mild cognitive impairment, so stated: Secondary | ICD-10-CM | POA: Insufficient documentation

## 2022-07-10 NOTE — Assessment & Plan Note (Signed)
Noninvasive cardiac workup negative.  Did not occur with ETT Myoview,  occurs which physically strenuous and prlonged activities (chopping wood for several hours ) .  Encouarged to make lifestyle changes

## 2022-07-10 NOTE — Assessment & Plan Note (Signed)
Diagnosed by [name] during recent workup for headache and CTS .  Brain MRI report reviewed

## 2022-07-10 NOTE — Assessment & Plan Note (Addendum)
Loss of  control  with lower dose of amlodipine .  Continue losartan ,  increase amlodipine to 10 mg.  If not tolerated, will add low dose beta blocker bisoprolol

## 2022-08-09 DIAGNOSIS — H2513 Age-related nuclear cataract, bilateral: Secondary | ICD-10-CM | POA: Diagnosis not present

## 2022-08-09 DIAGNOSIS — H43813 Vitreous degeneration, bilateral: Secondary | ICD-10-CM | POA: Diagnosis not present

## 2022-08-27 ENCOUNTER — Telehealth: Payer: Self-pay | Admitting: Internal Medicine

## 2022-08-27 NOTE — Telephone Encounter (Signed)
Contacted Gerald Flores to schedule their annual wellness visit. Appointment made for 09/10/2022.  Gerald Flores; Care Guide Ambulatory Clinical Support [place] [facility] Direct Dial: [phone]

## 2022-09-07 DIAGNOSIS — G3184 Mild cognitive impairment, so stated: Secondary | ICD-10-CM | POA: Diagnosis not present

## 2022-09-07 DIAGNOSIS — R200 Anesthesia of skin: Secondary | ICD-10-CM | POA: Diagnosis not present

## 2022-09-10 ENCOUNTER — Ambulatory Visit (INDEPENDENT_AMBULATORY_CARE_PROVIDER_SITE_OTHER): Payer: Medicare HMO

## 2022-09-10 VITALS — Wt 212.0 lb

## 2022-09-10 DIAGNOSIS — Z0000 Encounter for general adult medical examination without abnormal findings: Secondary | ICD-10-CM | POA: Diagnosis not present

## 2022-09-10 NOTE — Progress Notes (Addendum)
Subjective:   Gerald Flores is a 72 y.o. male who presents for Medicare Annual/Subsequent preventive examination.  Review of Systems    I connected with  Kobey Lichtenfels Boster on 09/10/22 by a audio enabled telemedicine application and verified that I am speaking with the correct person using two identifiers.  Patient Medicare AWV questionnaire was completed by the patient on 09/09/22; I have confirmed that all information answered by patient is correct and no changes since this date.     Patient Location: Home  Provider Location: Home Office  I discussed the limitations of evaluation and management by telemedicine. The patient expressed understanding and agreed to proceed.  Cardiac Risk Factors include: hypertension     Objective:    Today's Vitals   09/10/22 1306  Weight: 212 lb (96.2 kg)   Body mass index is 28.75 kg/m.     09/10/2022    1:12 PM 09/08/2021    8:26 AM 11/23/2019   11:15 AM 11/20/2018   11:13 AM  Advanced Directives  Does Patient Have a Medical Advance Directive? No No No No  Would patient like information on creating a medical advance directive? No - Patient declined No - Patient declined No - Patient declined Yes (MAU/Ambulatory/Procedural Areas - Information given)    Current Medications (verified) Outpatient Encounter Medications as of 09/10/2022  Medication Sig   amLODipine (NORVASC) 10 MG tablet Take 1 tablet (10 mg total) by mouth daily.   atorvastatin (LIPITOR) 40 MG tablet Take 1 tablet (40 mg total) by mouth daily.   Cholecalciferol (VITAMIN D3) 1000 units CAPS Take 1 capsule by mouth daily.   colchicine 0.6 MG tablet Take 1 tablet (0.6 mg total) by mouth 2 (two) times daily. For 7 days, as needed  for gout flare   escitalopram (LEXAPRO) 5 MG tablet Take 5 mg by mouth daily.   losartan (COZAAR) 100 MG tablet Take 1 tablet (100 mg total) by mouth daily.   Multiple Vitamin (MULTIVITAMIN) tablet Take 1 tablet by mouth daily.   nitroGLYCERIN (NITROSTAT)  0.4 MG SL tablet Place 1 tablet (0.4 mg total) under the tongue every 5 (five) minutes as needed for chest pain. Max dose 3 tablets   No facility-administered encounter medications on file as of 09/10/2022.    Allergies (verified) Patient has no allergy information on record.   History: Past Medical History:  Diagnosis Date   Animal bite of hand, initial encounter 04/05/2022   Foreign body in ear, left, initial encounter 04/05/2022   Gouty arthritis    Hypertension    Hypertriglyceridemia    Special screening for malignant neoplasm of prostate    Special screening for malignant neoplasms, colon    Transitional cell carcinoma (HCC) 04/30/1992   treated 1994, Dr. Evelene Croon   History reviewed. No pertinent surgical history. Family History  Problem Relation Age of Onset   Hypertension Mother    Kidney disease Mother    Hypertension Father    Hyperlipidemia Sister    Hypertension Sister    Schizophrenia Brother    Hyperlipidemia Brother    Hypertension Brother    Social History   Socioeconomic History   Marital status: Married    Spouse name: Not on file   Number of children: Not on file   Years of education: Not on file   Highest education level: Not on file  Occupational History   Occupation: second shift supervisor  Tobacco Use   Smoking status: Former    Types: Pipe, Software engineer  Smokeless tobacco: Never   Tobacco comments:    Quit 04/2006  Vaping Use   Vaping Use: Never used  Substance and Sexual Activity   Alcohol use: No   Drug use: Never   Sexual activity: Not on file  Other Topics Concern   Not on file  Social History Narrative   Lives with spouse, married May, 2009   Social Determinants of Health   Financial Resource Strain: Low Risk  (09/10/2022)   Overall Financial Resource Strain (CARDIA)    Difficulty of Paying Living Expenses: Not hard at all  Food Insecurity: No Food Insecurity (09/10/2022)   Hunger Vital Sign    Worried About Running Out of Food in  the Last Year: Never true    Ran Out of Food in the Last Year: Never true  Transportation Needs: No Transportation Needs (09/10/2022)   PRAPARE - Administrator, Civil Service (Medical): No    Lack of Transportation (Non-Medical): No  Physical Activity: Insufficiently Active (09/10/2022)   Exercise Vital Sign    Days of Exercise per Week: 7 days    Minutes of Exercise per Session: 20 min  Stress: No Stress Concern Present (09/10/2022)   [employer] of Occupational Health - Occupational Stress Questionnaire    Feeling of Stress : Not at all  Social Connections: Socially Integrated (09/10/2022)   Social Connection and Isolation Panel [NHANES]    Frequency of Communication with Friends and Family: More than three times a week    Frequency of Social Gatherings with Friends and Family: More than three times a week    Attends Religious Services: More than 4 times per year    Active Member of [employer] or Organizations: Yes    Attends Engineer, structural: More than 4 times per year    Marital Status: Married    Tobacco Counseling Counseling given: Yes Tobacco comments: Quit 04/2006   Clinical Intake:  Pre-visit preparation completed: Yes  Pain : No/denies pain     BMI - recorded: 28.75 Nutritional Status: BMI 25 -29 Overweight Nutritional Risks: None Diabetes: No  How often do you need to have someone help you when you read instructions, pamphlets, or other written materials from your doctor or pharmacy?: 1 - Never  Diabetic?NO  Interpreter Needed?: No  Information entered by :: Fredirick Maudlin   Activities of Daily Living    09/09/2022    2:07 PM  In your present state of health, do you have any difficulty performing the following activities:  Hearing? 1  Vision? 0  Difficulty concentrating or making decisions? 0  Walking or climbing stairs? 0  Dressing or bathing? 0  Doing errands, shopping? 0  Preparing Food and eating ? N  Using the  Toilet? N  In the past six months, have you accidently leaked urine? N  Do you have problems with loss of bowel control? N  Managing your Medications? N  Managing your Finances? N  Housekeeping or managing your Housekeeping? N    Patient Care Team: Sherlene Shams, MD as PCP - General (Internal Medicine)  Indicate any recent Medical Services you may have received from other than [facility] providers in the past year (date may be approximate).     Assessment:   This is a routine wellness examination for Burhan.  Hearing/Vision screen Hearing Screening - Comments:: Patient  wearing hearing aide in both Vision Screening - Comments:: Wears rx glasses - up to date with routine eye exams with  Dr  King   Dietary issues and exercise activities discussed: Current Exercise Habits: Home exercise routine, Type of exercise: walking, Time (Minutes): 60, Frequency (Times/Week): 5, Weekly Exercise (Minutes/Week): 300, Intensity: Mild, Exercise limited by: None identified   Goals Addressed               This Visit's Progress     Patient Stated     I want to average walking 5 miles per day (pt-stated)   On track     Depression Screen    09/10/2022    1:09 PM 07/09/2022    1:01 PM 04/05/2022    8:25 AM 03/07/2022    3:49 PM 09/08/2021    8:25 AM 04/27/2021    9:00 AM 10/21/2020    2:48 PM  PHQ 2/9 Scores  PHQ - 2 Score 0 0 0 0 0 0 0  PHQ- 9 Score       1    Fall Risk    09/09/2022    2:07 PM 07/09/2022    1:01 PM 04/05/2022    8:24 AM 03/07/2022    3:49 PM 09/08/2021    8:18 AM  Fall Risk   Falls in the past year? 0 0 0 0 0  Number falls in past yr: 0 0 0    Injury with Fall? 0 0 0    Risk for fall due to : No Fall Risks No Fall Risks No Fall Risks No Fall Risks   Follow up Falls prevention discussed;Falls evaluation completed Falls evaluation completed Falls evaluation completed Falls evaluation completed Falls evaluation completed    FALL RISK PREVENTION PERTAINING TO THE  HOME:  Any stairs in or around the home? Yes  If so, are there any without handrails? Yes  Home free of loose throw rugs in walkways, pet beds, electrical cords, etc? No  Adequate lighting in your home to reduce risk of falls? Yes   ASSISTIVE DEVICES UTILIZED TO PREVENT FALLS:  Life alert? No  Use of a cane, walker or w/c? No  Grab bars in the bathroom? No  Shower chair or bench in shower? No  Elevated toilet seat or a handicapped toilet? No   TIMED UP AND GO:  Was the test performed?  NO televisit .   Cognitive Function:        09/10/2022    1:10 PM 11/20/2018   11:22 AM  6CIT Screen  What Year? 0 points 0 points  What month? 0 points 0 points  What time? 0 points 0 points  Count back from 20 0 points 0 points  Months in reverse 0 points 0 points  Repeat phrase 0 points 0 points  Total Score 0 points 0 points    Immunizations Immunization History  Administered Date(s) Administered   Fluad Quad(high Dose 65+) 04/15/2019   PFIZER(Purple Top)SARS-COV-2 Vaccination 06/20/2019, 07/15/2019   Pneumococcal Conjugate-13 09/27/2015   Td 04/05/2022   Tdap 07/01/2013   Zoster Recombinat (Shingrix) 12/20/2017, 04/15/2018    TDAP status: Up to date  Flu Vaccine status: Declined, Education has been provided regarding the importance of this vaccine but patient still declined. Advised may receive this vaccine at local pharmacy or Health Dept. Aware to provide a copy of the vaccination record if obtained from local pharmacy or Health Dept. Verbalized acceptance and understanding.  Pneumococcal vaccine status: Declined,  Education has been provided regarding the importance of this vaccine but patient still declined. Advised may receive this vaccine at local pharmacy or Health Dept. Aware  to provide a copy of the vaccination record if obtained from local pharmacy or Health Dept. Verbalized acceptance and understanding.   Covid-19 vaccine status: Information provided on how to obtain  vaccines.   Qualifies for Shingles Vaccine? Yes   Zostavax completed Yes   Shingrix Completed?: No.    Education has been provided regarding the importance of this vaccine. Patient has been advised to call insurance company to determine out of pocket expense if they have not yet received this vaccine. Advised may also receive vaccine at local pharmacy or Health Dept. Verbalized acceptance and understanding.  Screening Tests Health Maintenance  Topic Date Due   COVID-19 Vaccine (3 - Pfizer risk series) 08/12/2019   Pneumonia Vaccine 30+ Years old (2 of 2 - PPSV23 or PCV20) 03/08/2023 (Originally 09/26/2016)   INFLUENZA VACCINE  11/29/2022   Medicare Annual Wellness (AWV)  09/10/2023   Fecal DNA (Cologuard)  02/14/2024   DTaP/Tdap/Td (3 - Td or Tdap) 04/05/2032   Hepatitis C Screening  Completed   Zoster Vaccines- Shingrix  Completed   HPV VACCINES  Aged Out    Health Maintenance  Health Maintenance Due  Topic Date Due   COVID-19 Vaccine (3 - Pfizer risk series) 08/12/2019    Colorectal cancer screening: Type of screening: Cologuard. Completed 01/12/21. Repeat every 3 years  Lung Cancer Screening: (Low Dose CT Chest recommended if Age 20-80 years, 30 pack-year currently smoking OR have quit w/in 15years.) does not qualify.     Additional Screening:  Hepatitis C Screening: does qualify; Completed 10/05/16 Vision Screening: Recommended annual ophthalmology exams for early detection of glaucoma and other disorders of the eye. Is the patient up to date with their annual eye exam?  Yes  Who is the provider or what is the name of the office in which the patient attends annual eye exams? Dr Brooke Dare  If pt is not established with a provider, would they like to be referred to a provider to establish care? No .   Dental Screening: Recommended annual dental exams for proper oral hygiene  Community Resource Referral / Chronic Care Management: CRR required this visit?  No   CCM required  this visit?  No      Plan:     I have personally reviewed and noted the following in the patient's chart:   Medical and social history Use of alcohol, tobacco or illicit drugs  Current medications and supplements including opioid prescriptions. Patient is not currently taking opioid prescriptions. Functional ability and status Nutritional status Physical activity Advanced directives List of other physicians Hospitalizations, surgeries, and ER visits in previous 12 months Vitals Screenings to include cognitive, depression, and falls Referrals and appointments  In addition, I have reviewed and discussed with patient certain preventive protocols, quality metrics, and best practice recommendations. A written personalized care plan for preventive services as well as general preventive health recommendations were provided to patient.     Annabell Sabal, CMA   09/10/2022   Nurse Notes: None   I have reviewed the above information and agree with above.   Duncan Dull, MD

## 2022-09-10 NOTE — Patient Instructions (Signed)
Gerald Flores , Thank you for taking time to come for your Medicare Wellness Visit. I appreciate your ongoing commitment to your health goals. Please review the following plan we discussed and let me know if I can assist you in the future.   These are the goals we discussed:  Goals       Patient Stated     I want to average walking 5 miles per day (pt-stated)        This is a list of the screening recommended for you and due dates:  Health Maintenance  Topic Date Due   COVID-19 Vaccine (3 - Pfizer risk series) 08/12/2019   Pneumonia Vaccine (2 of 2 - PPSV23 or PCV20) 03/08/2023*   Flu Shot  11/29/2022   Medicare Annual Wellness Visit  09/10/2023   Cologuard (Stool DNA test)  02/14/2024   DTaP/Tdap/Td vaccine (3 - Td or Tdap) 04/05/2032   Hepatitis C Screening: USPSTF Recommendation to screen - Ages 45-79 yo.  Completed   Zoster (Shingles) Vaccine  Completed   HPV Vaccine  Aged Out  *Topic was postponed. The date shown is not the original due date.    Advanced directives: Advance directive discussed with you today. Even though you declined this today, please call our office should you change your mind, and we can give you the proper paperwork for you to fill out.   Conditions/risks identified: Aim for 30 minutes of exercise or brisk walking, 6-8 glasses of water, and 5 servings of fruits and vegetables each day.   Next appointment: Follow up in one year for your annual wellness visit.   Preventive Care 73 Years and Older, Male  Preventive care refers to lifestyle choices and visits with your health care provider that can promote health and wellness. What does preventive care include? A yearly physical exam. This is also called an annual well check. Dental exams once or twice a year. Routine eye exams. Ask your health care provider how often you should have your eyes checked. Personal lifestyle choices, including: Daily care of your teeth and gums. Regular physical activity. Eating  a healthy diet. Avoiding tobacco and drug use. Limiting alcohol use. Practicing safe sex. Taking low doses of aspirin every day. Taking vitamin and mineral supplements as recommended by your health care provider. What happens during an annual well check? The services and screenings done by your health care provider during your annual well check will depend on your age, overall health, lifestyle risk factors, and family history of disease. Counseling  Your health care provider may ask you questions about your: Alcohol use. Tobacco use. Drug use. Emotional well-being. Home and relationship well-being. Sexual activity. Eating habits. History of falls. Memory and ability to understand (cognition). Work and work Astronomer. Screening  You may have the following tests or measurements: Height, weight, and BMI. Blood pressure. Lipid and cholesterol levels. These may be checked every 5 years, or more frequently if you are over 23 years old. Skin check. Lung cancer screening. You may have this screening every year starting at age 33 if you have a 30-pack-year history of smoking and currently smoke or have quit within the past 15 years. Fecal occult blood test (FOBT) of the stool. You may have this test every year starting at age 35. Flexible sigmoidoscopy or colonoscopy. You may have a sigmoidoscopy every 5 years or a colonoscopy every 10 years starting at age 63. Prostate cancer screening. Recommendations will vary depending on your family history and other risks.  Hepatitis C blood test. Hepatitis B blood test. Sexually transmitted disease (STD) testing. Diabetes screening. This is done by checking your blood sugar (glucose) after you have not eaten for a while (fasting). You may have this done every 1-3 years. Abdominal aortic aneurysm (AAA) screening. You may need this if you are a current or former smoker. Osteoporosis. You may be screened starting at age 24 if you are at high  risk. Talk with your health care provider about your test results, treatment options, and if necessary, the need for more tests. Vaccines  Your health care provider may recommend certain vaccines, such as: Influenza vaccine. This is recommended every year. Tetanus, diphtheria, and acellular pertussis (Tdap, Td) vaccine. You may need a Td booster every 10 years. Zoster vaccine. You may need this after age 46. Pneumococcal 13-valent conjugate (PCV13) vaccine. One dose is recommended after age 65. Pneumococcal polysaccharide (PPSV23) vaccine. One dose is recommended after age 63. Talk to your health care provider about which screenings and vaccines you need and how often you need them. This information is not intended to replace advice given to you by your health care provider. Make sure you discuss any questions you have with your health care provider. Document Released: 05/13/2015 Document Revised: 01/04/2016 Document Reviewed: 02/15/2015 Elsevier Interactive Patient Education  2017 [employer].  Fall Prevention in the Home Falls can cause injuries. They can happen to people of all ages. There are many things you can do to make your home safe and to help prevent falls. What can I do on the outside of my home? Regularly fix the edges of walkways and driveways and fix any cracks. Remove anything that might make you trip as you walk through a door, such as a raised step or threshold. Trim any bushes or trees on the path to your home. Use bright outdoor lighting. Clear any walking paths of anything that might make someone trip, such as rocks or tools. Regularly check to see if handrails are loose or broken. Make sure that both sides of any steps have handrails. Any raised decks and porches should have guardrails on the edges. Have any leaves, snow, or ice cleared regularly. Use sand or salt on walking paths during winter. Clean up any spills in your garage right away. This includes oil or  grease spills. What can I do in the bathroom? Use night lights. Install grab bars by the toilet and in the tub and shower. Do not use towel bars as grab bars. Use non-skid mats or decals in the tub or shower. If you need to sit down in the shower, use a plastic, non-slip stool. Keep the floor dry. Clean up any water that spills on the floor as soon as it happens. Remove soap buildup in the tub or shower regularly. Attach bath mats securely with double-sided non-slip rug tape. Do not have throw rugs and other things on the floor that can make you trip. What can I do in the bedroom? Use night lights. Make sure that you have a light by your bed that is easy to reach. Do not use any sheets or blankets that are too big for your bed. They should not hang down onto the floor. Have a firm chair that has side arms. You can use this for support while you get dressed. Do not have throw rugs and other things on the floor that can make you trip. What can I do in the kitchen? Clean up any spills right away. Avoid  walking on wet floors. Keep items that you use a lot in easy-to-reach places. If you need to reach something above you, use a strong step stool that has a grab bar. Keep electrical cords out of the way. Do not use floor polish or wax that makes floors slippery. If you must use wax, use non-skid floor wax. Do not have throw rugs and other things on the floor that can make you trip. What can I do with my stairs? Do not leave any items on the stairs. Make sure that there are handrails on both sides of the stairs and use them. Fix handrails that are broken or loose. Make sure that handrails are as long as the stairways. Check any carpeting to make sure that it is firmly attached to the stairs. Fix any carpet that is loose or worn. Avoid having throw rugs at the top or bottom of the stairs. If you do have throw rugs, attach them to the floor with carpet tape. Make sure that you have a light switch  at the top of the stairs and the bottom of the stairs. If you do not have them, ask someone to add them for you. What else can I do to help prevent falls? Wear shoes that: Do not have high heels. Have rubber bottoms. Are comfortable and fit you well. Are closed at the toe. Do not wear sandals. If you use a stepladder: Make sure that it is fully opened. Do not climb a closed stepladder. Make sure that both sides of the stepladder are locked into place. Ask someone to hold it for you, if possible. Clearly mark and make sure that you can see: Any grab bars or handrails. First and last steps. Where the edge of each step is. Use tools that help you move around (mobility aids) if they are needed. These include: Canes. Walkers. Scooters. Crutches. Turn on the lights when you go into a dark area. Replace any light bulbs as soon as they burn out. Set up your furniture so you have a clear path. Avoid moving your furniture around. If any of your floors are uneven, fix them. If there are any pets around you, be aware of where they are. Review your medicines with your doctor. Some medicines can make you feel dizzy. This can increase your chance of falling. Ask your doctor what other things that you can do to help prevent falls. This information is not intended to replace advice given to you by your health care provider. Make sure you discuss any questions you have with your health care provider. Document Released: 02/10/2009 Document Revised: 09/22/2015 Document Reviewed: 05/21/2014 Elsevier Interactive Patient Education  2017 [employer].

## 2022-09-27 DIAGNOSIS — G5603 Carpal tunnel syndrome, bilateral upper limbs: Secondary | ICD-10-CM | POA: Diagnosis not present

## 2022-10-18 DIAGNOSIS — G5603 Carpal tunnel syndrome, bilateral upper limbs: Secondary | ICD-10-CM | POA: Diagnosis not present

## 2022-10-29 ENCOUNTER — Other Ambulatory Visit: Payer: Self-pay | Admitting: Internal Medicine

## 2022-10-30 NOTE — Telephone Encounter (Signed)
Looks like rx was increased to 10 mg at last appt. Is it okay to refuse?

## 2022-11-20 DIAGNOSIS — R079 Chest pain, unspecified: Secondary | ICD-10-CM | POA: Diagnosis not present

## 2022-11-20 DIAGNOSIS — I10 Essential (primary) hypertension: Secondary | ICD-10-CM | POA: Diagnosis not present

## 2022-11-20 DIAGNOSIS — R9431 Abnormal electrocardiogram [ECG] [EKG]: Secondary | ICD-10-CM | POA: Diagnosis not present

## 2022-11-20 DIAGNOSIS — R0602 Shortness of breath: Secondary | ICD-10-CM | POA: Diagnosis not present

## 2022-11-20 DIAGNOSIS — E781 Pure hyperglyceridemia: Secondary | ICD-10-CM | POA: Diagnosis not present

## 2022-11-30 ENCOUNTER — Other Ambulatory Visit: Payer: Self-pay | Admitting: Internal Medicine

## 2022-12-21 DIAGNOSIS — Z6830 Body mass index (BMI) 30.0-30.9, adult: Secondary | ICD-10-CM | POA: Diagnosis not present

## 2022-12-21 DIAGNOSIS — F325 Major depressive disorder, single episode, in full remission: Secondary | ICD-10-CM | POA: Diagnosis not present

## 2022-12-21 DIAGNOSIS — Z87891 Personal history of nicotine dependence: Secondary | ICD-10-CM | POA: Diagnosis not present

## 2022-12-21 DIAGNOSIS — I209 Angina pectoris, unspecified: Secondary | ICD-10-CM | POA: Diagnosis not present

## 2022-12-21 DIAGNOSIS — Z833 Family history of diabetes mellitus: Secondary | ICD-10-CM | POA: Diagnosis not present

## 2022-12-21 DIAGNOSIS — Z8249 Family history of ischemic heart disease and other diseases of the circulatory system: Secondary | ICD-10-CM | POA: Diagnosis not present

## 2022-12-21 DIAGNOSIS — I10 Essential (primary) hypertension: Secondary | ICD-10-CM | POA: Diagnosis not present

## 2022-12-21 DIAGNOSIS — Z809 Family history of malignant neoplasm, unspecified: Secondary | ICD-10-CM | POA: Diagnosis not present

## 2022-12-21 DIAGNOSIS — Z008 Encounter for other general examination: Secondary | ICD-10-CM | POA: Diagnosis not present

## 2022-12-21 DIAGNOSIS — E785 Hyperlipidemia, unspecified: Secondary | ICD-10-CM | POA: Diagnosis not present

## 2022-12-21 DIAGNOSIS — Z8551 Personal history of malignant neoplasm of bladder: Secondary | ICD-10-CM | POA: Diagnosis not present

## 2022-12-21 DIAGNOSIS — Z818 Family history of other mental and behavioral disorders: Secondary | ICD-10-CM | POA: Diagnosis not present

## 2022-12-21 DIAGNOSIS — E669 Obesity, unspecified: Secondary | ICD-10-CM | POA: Diagnosis not present

## 2022-12-29 ENCOUNTER — Other Ambulatory Visit: Payer: Self-pay | Admitting: Internal Medicine

## 2023-01-24 DIAGNOSIS — R899 Unspecified abnormal finding in specimens from other organs, systems and tissues: Secondary | ICD-10-CM | POA: Diagnosis not present

## 2023-01-24 DIAGNOSIS — G5603 Carpal tunnel syndrome, bilateral upper limbs: Secondary | ICD-10-CM | POA: Diagnosis not present

## 2023-01-24 DIAGNOSIS — G3184 Mild cognitive impairment, so stated: Secondary | ICD-10-CM | POA: Diagnosis not present

## 2023-01-27 ENCOUNTER — Other Ambulatory Visit: Payer: Self-pay | Admitting: Internal Medicine

## 2023-02-05 DIAGNOSIS — G5603 Carpal tunnel syndrome, bilateral upper limbs: Secondary | ICD-10-CM | POA: Diagnosis not present

## 2023-02-17 ENCOUNTER — Encounter: Payer: Self-pay | Admitting: Emergency Medicine

## 2023-02-17 ENCOUNTER — Other Ambulatory Visit: Payer: Self-pay

## 2023-02-17 ENCOUNTER — Emergency Department
Admission: EM | Admit: 2023-02-17 | Discharge: 2023-02-17 | Disposition: A | Discharge status: Home / Self Care | Payer: Medicare HMO | Attending: Emergency Medicine | Admitting: Emergency Medicine

## 2023-02-17 DIAGNOSIS — Z79899 Other long term (current) drug therapy: Secondary | ICD-10-CM | POA: Diagnosis not present

## 2023-02-17 DIAGNOSIS — W540XXA Bitten by dog, initial encounter: Secondary | ICD-10-CM | POA: Insufficient documentation

## 2023-02-17 DIAGNOSIS — I10 Essential (primary) hypertension: Secondary | ICD-10-CM | POA: Diagnosis not present

## 2023-02-17 DIAGNOSIS — S51851A Open bite of right forearm, initial encounter: Secondary | ICD-10-CM | POA: Diagnosis not present

## 2023-02-17 DIAGNOSIS — S51811A Laceration without foreign body of right forearm, initial encounter: Secondary | ICD-10-CM | POA: Diagnosis not present

## 2023-02-17 DIAGNOSIS — Z8551 Personal history of malignant neoplasm of bladder: Secondary | ICD-10-CM | POA: Insufficient documentation

## 2023-02-17 DIAGNOSIS — S59911A Unspecified injury of right forearm, initial encounter: Secondary | ICD-10-CM | POA: Diagnosis present

## 2023-02-17 MED ORDER — LIDOCAINE-EPINEPHRINE 2 %-1:100000 IJ SOLN
20.0000 mL | Freq: Once | INTRAMUSCULAR | Status: AC
  Administered 2023-02-17: 20 mL
  Filled 2023-02-17: qty 1

## 2023-02-17 MED ORDER — IBUPROFEN 800 MG PO TABS
800.0000 mg | ORAL_TABLET | Freq: Once | ORAL | Status: AC
  Administered 2023-02-17: 800 mg via ORAL
  Filled 2023-02-17: qty 1

## 2023-02-17 MED ORDER — AMOXICILLIN-POT CLAVULANATE 875-125 MG PO TABS: 1.0000 | ORAL_TABLET | Freq: Two times a day (BID) | ORAL | 0 refills | Status: AC

## 2023-02-17 NOTE — Discharge Instructions (Addendum)
You were evaluated in the emergency department for a dog bite. It was repaired loosely with sutures. Keep the area clean and dry.  Wash multiple times per day with soap and water.  Do not go into the ocean or swimming pool.  Return to the emergency department or your primary care physician's office in 7-10 days for suture removal.  Take the antibiotics as prescribed. Return to the emergency department for:  -- Fever > 100.1F -- Increase pain in the wound -- Increase redness and swelling -- Pus coming from the wound -- Wound bleeds more than a small amount or it does not stop -- Wound edges come apart -- Severe pain -- Weakness or numbness in the affected area  Or any other new or worsening symptoms. It was a pleasure caring for you.

## 2023-02-17 NOTE — ED Triage Notes (Signed)
Patient to ED via POV for dog bite. Pt reports breaking up a dog fight and was bit by his dog. Dog's shots up to date per wife. Two large bites to right arm with multiple scratches. Bleeding controlled at this time.

## 2023-02-17 NOTE — ED Provider Triage Note (Signed)
Emergency Medicine Provider Triage Evaluation Note  KONER FRERKING , a 72 y.o. male  was evaluated in triage.  Pt complains of animal bite. He was breaking up a fight between his dogs when he got bit. He states that they are UTD on their rabies vaccines. He is UTD on his tetanus shot. .  Review of Systems  Positive: Pain to right forearm Negative:   Physical Exam  BP (!) 120/91 (BP Location: Left Arm)   Pulse 69   Temp (!) 97.5 F (36.4 C)   Resp 18   Ht 6' (1.829 m)   Wt 96.6 kg   SpO2 94%   BMI 28.89 kg/m  Gen:   Awake, no distress   Resp:  Normal effort  MSK:   Moves extremities without difficulty  Other:   Large and deep laceration to the lateral right forearm with multiple smaller lacerations and puncture wound. Radial pulse is 2+ and regular, sensation intact distal to the wound, full ROM of right arm maintained  Medical Decision Making  Medically screening exam initiated at 4:52 PM.  Appropriate orders placed.  Zamarian Kwak Biddle was informed that the remainder of the evaluation will be completed by another provider, this initial triage assessment does not replace that evaluation, and the importance of remaining in the ED until their evaluation is complete.     Cameron Ali, PA-C 02/17/23 1655

## 2023-02-17 NOTE — ED Provider Notes (Signed)
[facility] Provider Note    Event Date/Time   First MD Initiated Contact with Patient 02/17/23 1823     (approximate)   History   Animal Bite   HPI  Gerald Flores is a 72 y.o. male who presents today for evaluation of dog bite to his right arm.  Patient reports that he was breaking up a fight between his dogs and 1 bit him on the arm.  He reports that they are all up-to-date on their vaccinations.  He reports he got a tetanus shot last year.  Denies any numbness or tingling or weakness.  He denies any other injuries.  Patient Active Problem List   Diagnosis Date Noted   MCI (mild cognitive impairment) 07/10/2022   Carpal tunnel syndrome on both sides 07/09/2022   Chest pain 03/07/2022   Increased prostate specific antigen (PSA) velocity 11/21/2020   Numbness and tingling in both hands 09/01/2020   Fatigue 09/01/2020   Bladder cancer (HCC) 09/01/2020   Prediabetes 09/01/2020   Encounter for long-term (current) use of high-risk medication 02/07/2014   Overweight (BMI 25.0-29.9) 07/04/2013   Visit for preventive health examination 12/08/2012   Screening for colon cancer 06/17/2011   Gouty arthritis    Hypertension    Hyperlipidemia with target LDL less than 100           Physical Exam   Triage Vital Signs: ED Triage Vitals  Encounter Vitals Group     BP 02/17/23 1649 (!) 120/91     Systolic BP Percentile --      Diastolic BP Percentile --      Pulse Rate 02/17/23 1649 69     Resp 02/17/23 1649 18     Temp 02/17/23 1649 (!) 97.5 F (36.4 C)     Temp src --      SpO2 02/17/23 1649 94 %     Weight 02/17/23 1648 213 lb (96.6 kg)     Height 02/17/23 1648 6' (1.829 m)     Head Circumference --      Peak Flow --      Pain Score 02/17/23 1648 9     Pain Loc --      Pain Education --      Exclude from Growth Chart --     Most recent vital signs: Vitals:   02/17/23 1649  BP: (!) 120/91  Pulse: 69  Resp: 18  Temp: (!) 97.5 F (36.4  C)  SpO2: 94%    Physical Exam Vitals and nursing note reviewed.  Constitutional:      General: Awake and alert. No acute distress.    Appearance: Normal appearance. The patient is normal weight.  HENT:     Head: Normocephalic and atraumatic.     Mouth: Mucous membranes are moist.  Eyes:     General: PERRL. Normal EOMs        Right eye: No discharge.        Left eye: No discharge.     Conjunctiva/sclera: Conjunctivae normal.  Cardiovascular:     Rate and Rhythm: Normal rate and regular rhythm.     Pulses: Normal pulses.  Pulmonary:     Effort: Pulmonary effort is normal. No respiratory distress.     Breath sounds: Normal breath sounds.  Abdominal:     Abdomen is soft. There is no abdominal tenderness. No rebound or guarding. No distention. Musculoskeletal:        General: No swelling. Normal range of motion.  Cervical back: Normal range of motion and neck supple.  Right forearm with large jagged deep laceration of the dorsal forearm measuring approximately 8 cm with several surrounding superficial abrasions.  A second laceration measuring approximate 1.5 cm to the underside of the forearm.  Full and normal range of motion of wrist and elbow.  Normal grip strength.  Normal resisted supination and pronation.  Normal radial pulse.  Normal intrinsic muscle function of the hand.  Compartments are soft and compressible throughout.  No visible foreign body.  No pulsatile bleeding.  Sensation intact light touch. Skin:    General: Skin is warm and dry.     Capillary Refill: Capillary refill takes less than 2 seconds.     Findings: No rash.  Neurological:     Mental Status: The patient is awake and alert.      ED Results / Procedures / Treatments   Labs (all labs ordered are listed, but only abnormal results are displayed) Labs Reviewed - No data to display   EKG     RADIOLOGY     PROCEDURES:  Critical Care performed:   Marland KitchenMarland KitchenLaceration Repair  Date/Time: 02/17/2023  8:07 PM  Performed by: Jackelyn Hoehn, PA-C Authorized by: Jackelyn Hoehn, PA-C   Consent:    Consent obtained:  Verbal   Consent given by:  Patient   Risks, benefits, and alternatives were discussed: yes     Risks discussed:  Infection, need for additional repair, nerve damage, poor cosmetic result, poor wound healing, pain, retained foreign body, tendon damage and vascular damage   Alternatives discussed:  No treatment Universal protocol:    Procedure explained and questions answered to patient or proxy's satisfaction: yes     Relevant documents present and verified: yes     Required blood products, implants, devices, and special equipment available: yes     Site/side marked: yes     Immediately prior to procedure, a time out was called: yes     Patient identity confirmed:  Verbally with patient Anesthesia:    Anesthesia method:  Local infiltration   Local anesthetic:  Lidocaine 1% WITH epi Laceration details:    Location:  Shoulder/arm   Shoulder/arm location:  R lower arm   Length (cm):  8   Depth (mm):  6 Pre-procedure details:    Preparation:  Patient was prepped and draped in usual sterile fashion Exploration:    Limited defect created (wound extended): no     Hemostasis achieved with:  Direct pressure and epinephrine   Wound exploration: wound explored through full range of motion and entire depth of wound visualized     Wound extent: no foreign body, no signs of injury, no tendon damage, no underlying fracture and no vascular damage     Contaminated: yes (dog bite)   Treatment:    Area cleansed with:  Saline   Amount of cleaning:  Extensive   Irrigation solution:  Tap water and sterile saline   Irrigation method:  Tap, pressure wash and syringe   Debridement:  None   Undermining:  None   Scar revision: no   Skin repair:    Repair method:  Sutures   Suture size:  4-0   Suture material:  Nylon   Suture technique:  Simple interrupted   Number of sutures:   5 Approximation:    Approximation:  Loose Repair type:    Repair type:  Intermediate Post-procedure details:    Dressing:  Sterile dressing and non-adherent dressing  Procedure completion:  Tolerated well, no immediate complications .Marland KitchenLaceration Repair  Date/Time: 02/17/2023 8:09 PM  Performed by: Jackelyn Hoehn, PA-C Authorized by: Jackelyn Hoehn, PA-C   Consent:    Consent obtained:  Verbal   Consent given by:  Patient   Risks, benefits, and alternatives were discussed: yes     Risks discussed:  Infection, need for additional repair, nerve damage, pain, poor cosmetic result, poor wound healing, retained foreign body, tendon damage and vascular damage   Alternatives discussed:  No treatment Universal protocol:    Procedure explained and questions answered to patient or proxy's satisfaction: yes     Relevant documents present and verified: yes     Test results available: yes     Required blood products, implants, devices, and special equipment available: yes     Site/side marked: yes     Immediately prior to procedure, a time out was called: yes     Patient identity confirmed:  Verbally with patient Anesthesia:    Anesthesia method:  Local infiltration   Local anesthetic:  Lidocaine 1% WITH epi Laceration details:    Location:  Shoulder/arm   Shoulder/arm location:  R lower arm   Length (cm):  1.5   Depth (mm):  3 Pre-procedure details:    Preparation:  Patient was prepped and draped in usual sterile fashion Exploration:    Limited defect created (wound extended): no     Hemostasis achieved with:  Direct pressure and epinephrine   Wound exploration: wound explored through full range of motion and entire depth of wound visualized     Wound extent: fascia not violated, no foreign body, no signs of injury, no tendon damage, no underlying fracture and no vascular damage     Contaminated: yes (dog bite)   Treatment:    Area cleansed with:  Saline and soap and water   Amount  of cleaning:  Extensive   Irrigation solution:  Tap water and sterile saline   Irrigation method:  Pressure wash, syringe and tap   Debridement:  None   Undermining:  None   Scar revision: no   Skin repair:    Repair method:  Sutures   Suture size:  4-0   Suture material:  Nylon   Suture technique:  Simple interrupted   Number of sutures:  1 Approximation:    Approximation:  Loose Repair type:    Repair type:  Simple Post-procedure details:    Dressing:  Adhesive bandage, sterile dressing and non-adherent dressing   Procedure completion:  Tolerated well, no immediate complications    MEDICATIONS ORDERED IN ED: Medications  lidocaine-EPINEPHrine (XYLOCAINE W/EPI) 2 %-1:100000 (with pres) injection 20 mL (has no administration in time range)  ibuprofen (ADVIL) tablet 800 mg (800 mg Oral Given 02/17/23 1654)     IMPRESSION / MDM / ASSESSMENT AND PLAN / ED COURSE  I reviewed the triage vital signs and the nursing notes.   Differential diagnosis includes, but is not limited to, dog bite, laceration, tendon injury, retained foreign body.  Patient is awake and alert, hemodynamically stable and afebrile.  He is nontoxic in appearance.  There is no visible tendon or muscle injury.  There is no hematoma.  There is no pulsatile bleeding.  His tetanus is already up-to-date.  His dogs are fully vaccinated and therefore he does not need the rabies vaccination series which patient is in agreement with.  He has full normal range of motion of all joints in his affected extremity.  The area was anesthetized and irrigated extensively.  It was closed loosely with sutures.  We discussed suture care and timeline for removal.  Patient and his wife understand and agree with plan.  He was started on antibiotics.  We discussed return precautions and the importance of close outpatient follow-up.  Patient or stands and agrees with plan.  He was discharged in stable condition.   Patient's presentation is  most consistent with acute complicated illness / injury requiring diagnostic workup.    FINAL CLINICAL IMPRESSION(S) / ED DIAGNOSES   Final diagnoses:  Dog bite, initial encounter     Rx / DC Orders   ED Discharge Orders          Ordered    amoxicillin-clavulanate (AUGMENTIN) 875-125 MG tablet  2 times daily        02/17/23 2005             Note:  This document was prepared using Dragon voice recognition software and may include unintentional dictation errors.   Keturah Shavers 02/17/23 2016    Jene Every, MD 02/18/23 [phone]

## 2023-02-20 ENCOUNTER — Encounter: Payer: Self-pay | Admitting: Internal Medicine

## 2023-02-27 ENCOUNTER — Ambulatory Visit: Payer: Medicare HMO | Admitting: Internal Medicine

## 2023-02-27 ENCOUNTER — Encounter: Payer: Self-pay | Admitting: Internal Medicine

## 2023-02-27 VITALS — BP 136/74 | HR 70 | Ht 72.0 in | Wt 217.0 lb

## 2023-02-27 DIAGNOSIS — S41112D Laceration without foreign body of left upper arm, subsequent encounter: Secondary | ICD-10-CM | POA: Diagnosis not present

## 2023-02-27 DIAGNOSIS — W540XXD Bitten by dog, subsequent encounter: Secondary | ICD-10-CM

## 2023-02-27 MED ORDER — LOSARTAN POTASSIUM 100 MG PO TABS: 100.0000 mg | ORAL_TABLET | Freq: Every day | ORAL | 1 refills | Status: DC

## 2023-02-27 MED ORDER — AMLODIPINE BESYLATE 5 MG PO TABS: 5.0000 mg | ORAL_TABLET | Freq: Every day | ORAL | 1 refills | Status: DC

## 2023-02-27 MED ORDER — ATORVASTATIN CALCIUM 40 MG PO TABS: 40.0000 mg | ORAL_TABLET | Freq: Every day | ORAL | 3 refills | Status: DC

## 2023-02-27 NOTE — Patient Instructions (Signed)
Keeping the scabs covered in vaseline will help them soften up and fall off on their own in 2-3 days  Elevate the forearm when you are resting /relaxing and apply ice  packs for 30 minutes  every few hours to help resolve the fluid collection that is making it swell

## 2023-02-27 NOTE — Progress Notes (Addendum)
Subjective:  Patient ID: Gerald Flores, male    DOB: 12-07-1950  Age: 72 y.o. MRN: [id number]  CC: The primary encounter diagnosis was Dog bite, subsequent encounter. A diagnosis of Laceration of arm, left, multiple sites, subsequent encounter was also pertinent to this visit.   HPI Gerald Flores presents for  Chief Complaint  Patient presents with   Suture / Staple Removal    He sustained a dog bite to his forearm ten days ago while trying to separate his 2 vaccinated dogs.  Treated in ER ,  for a deep laceration 6   stitches place d and Augmentin prescribed   Outpatient Medications Prior to Visit  Medication Sig Dispense Refill   amLODipine (NORVASC) 10 MG tablet TAKE 1 TABLET BY MOUTH EVERY DAY 90 tablet 1   Cholecalciferol (VITAMIN D3) 1000 units CAPS Take 1 capsule by mouth daily.     colchicine 0.6 MG tablet Take 1 tablet (0.6 mg total) by mouth 2 (two) times daily. For 7 days, as needed  for gout flare 60 tablet 1   escitalopram (LEXAPRO) 5 MG tablet Take 5 mg by mouth daily.     Multiple Vitamin (MULTIVITAMIN) tablet Take 1 tablet by mouth daily.     nitroGLYCERIN (NITROSTAT) 0.4 MG SL tablet Place 1 tablet (0.4 mg total) under the tongue every 5 (five) minutes as needed for chest pain. Max dose 3 tablets 20 tablet 3   amLODipine (NORVASC) 5 MG tablet TAKE 1 TABLET (5 MG TOTAL) BY MOUTH DAILY. 90 tablet 0   atorvastatin (LIPITOR) 40 MG tablet Take 1 tablet (40 mg total) by mouth daily. 90 tablet 3   losartan (COZAAR) 100 MG tablet TAKE 1 TABLET BY MOUTH EVERY DAY 90 tablet 0   No facility-administered medications prior to visit.    Review of Systems;  Patient denies headache, fevers, malaise, unintentional weight loss, skin rash, eye pain, sinus congestion and sinus pain, sore throat, dysphagia,  hemoptysis , cough, dyspnea, wheezing, chest pain, palpitations, orthopnea, edema, abdominal pain, nausea, melena, diarrhea, constipation, flank pain, dysuria, hematuria, urinary   Frequency, nocturia, numbness, tingling, seizures,  Focal weakness, Loss of consciousness,  Tremor, insomnia, depression, anxiety, and suicidal ideation.      Objective:  BP 136/74   Pulse 70   Ht 6' (1.829 m)   Wt 217 lb (98.4 kg)   SpO2 97%   BMI 29.43 kg/m   BP Readings from Last 3 Encounters:  02/27/23 136/74  02/17/23 127/83  07/09/22 (!) 144/88    Wt Readings from Last 3 Encounters:  02/27/23 217 lb (98.4 kg)  02/17/23 213 lb (96.6 kg)  09/10/22 212 lb (96.2 kg)    Physical Exam Vitals reviewed.  Constitutional:      General: He is not in acute distress.    Appearance: Normal appearance. He is normal weight. He is not ill-appearing, toxic-appearing or diaphoretic.  HENT:     Head: Normocephalic.  Eyes:     General: No scleral icterus.       Right eye: No discharge.        Left eye: No discharge.     Conjunctiva/sclera: Conjunctivae normal.  Cardiovascular:     Rate and Rhythm: Normal rate and regular rhythm.     Heart sounds: Normal heart sounds.  Pulmonary:     Effort: Pulmonary effort is normal. No respiratory distress.     Breath sounds: Normal breath sounds.  Musculoskeletal:        General:  Normal range of motion.     Right forearm: Laceration present.       Arms:     Cervical back: Normal range of motion.     Comments: Multiple healing lacerations   Skin:    General: Skin is warm and dry.  Neurological:     General: No focal deficit present.     Mental Status: He is alert and oriented to person, place, and time. Mental status is at baseline.  Psychiatric:        Mood and Affect: Mood normal.        Behavior: Behavior normal.        Thought Content: Thought content normal.        Judgment: Judgment normal.    Lab Results  Component Value Date   HGBA1C 5.9 03/07/2022   HGBA1C 5.7 06/12/2021   HGBA1C 5.7 08/30/2020    Lab Results  Component Value Date   CREATININE 0.85 07/09/2022   CREATININE 0.92 03/07/2022   CREATININE 0.99  06/12/2021    Lab Results  Component Value Date   WBC 6.7 03/07/2022   HGB 13.7 03/07/2022   HCT 40.7 03/07/2022   PLT 254.0 03/07/2022   GLUCOSE 90 07/09/2022   CHOL 170 03/07/2022   TRIG 173.0 (H) 03/07/2022   HDL 48.50 03/07/2022   LDLDIRECT 98.0 03/07/2022   LDLCALC 87 03/07/2022   ALT 19 07/09/2022   AST 23 07/09/2022   NA 139 07/09/2022   K 4.3 07/09/2022   CL 102 07/09/2022   CREATININE 0.85 07/09/2022   BUN 15 07/09/2022   CO2 30 07/09/2022   TSH 1.60 03/07/2022   PSA 1.53 03/07/2022   HGBA1C 5.9 03/07/2022   MICROALBUR 1.4 03/07/2022    No results found.  Assessment & Plan:  .Dog bite, subsequent encounter Assessment & Plan: Wounds were sutured  by ER physician.  Sutures were removed today without difficulty  he still has a fair amount of swelling. Advised to elevated  forearm  and apply ice packs   Orders: -     Suture removal  Laceration of arm, left, multiple sites, subsequent encounter Assessment & Plan: Wounds were sutured  by ER physician.  Sutures were removed today without difficulty  he still has a fair amount of swelling. Advised to elevated  forearm  and apply ice packs    Other orders -     amLODIPine Besylate; Take 1 tablet (5 mg total) by mouth daily.  Dispense: 90 tablet; Refill: 1 -     Atorvastatin Calcium; Take 1 tablet (40 mg total) by mouth daily.  Dispense: 90 tablet; Refill: 3 -     Losartan Potassium; Take 1 tablet (100 mg total) by mouth daily.  Dispense: 90 tablet; Refill: 1     I provided 30 minutes of face-to-face time during this encounter reviewing patient's last visit with me, patient's  most recent visit with cardiology,  nephrology,  and neurology,  recent surgical and non surgical procedures, previous  labs and imaging studies, counseling on currently addressed issues,  and post visit ordering to diagnostics and therapeutics .   Follow-up: No follow-ups on file.   Gerald Shams, MD

## 2023-02-28 DIAGNOSIS — W540XXA Bitten by dog, initial encounter: Secondary | ICD-10-CM | POA: Insufficient documentation

## 2023-02-28 DIAGNOSIS — S41112D Laceration without foreign body of left upper arm, subsequent encounter: Secondary | ICD-10-CM | POA: Insufficient documentation

## 2023-02-28 HISTORY — DX: Laceration without foreign body of left upper arm, subsequent encounter: S41.112D

## 2023-02-28 NOTE — Assessment & Plan Note (Signed)
Wounds were sutured  by ER physician.  Sutures were removed today without difficulty  he still has a fair amount of swelling. Advised to elevated  forearm  and apply ice packs

## 2023-04-10 DIAGNOSIS — C678 Malignant neoplasm of overlapping sites of bladder: Secondary | ICD-10-CM | POA: Diagnosis not present

## 2023-05-23 DIAGNOSIS — R0602 Shortness of breath: Secondary | ICD-10-CM | POA: Diagnosis not present

## 2023-05-23 DIAGNOSIS — I10 Essential (primary) hypertension: Secondary | ICD-10-CM | POA: Diagnosis not present

## 2023-06-29 ENCOUNTER — Other Ambulatory Visit: Payer: Self-pay | Admitting: Internal Medicine

## 2023-07-11 ENCOUNTER — Telehealth: Payer: Self-pay | Admitting: Internal Medicine

## 2023-07-11 DIAGNOSIS — I10 Essential (primary) hypertension: Secondary | ICD-10-CM

## 2023-07-11 DIAGNOSIS — R972 Elevated prostate specific antigen [PSA]: Secondary | ICD-10-CM

## 2023-07-11 DIAGNOSIS — E785 Hyperlipidemia, unspecified: Secondary | ICD-10-CM

## 2023-07-11 DIAGNOSIS — R7303 Prediabetes: Secondary | ICD-10-CM

## 2023-07-11 NOTE — Addendum Note (Signed)
 Addended by: Sherlene Shams on: 07/11/2023 04:36 PM   Modules accepted: Orders

## 2023-07-11 NOTE — Telephone Encounter (Signed)
 Patient need lab orders.

## 2023-07-12 ENCOUNTER — Other Ambulatory Visit (INDEPENDENT_AMBULATORY_CARE_PROVIDER_SITE_OTHER)

## 2023-07-12 DIAGNOSIS — I10 Essential (primary) hypertension: Secondary | ICD-10-CM | POA: Diagnosis not present

## 2023-07-12 DIAGNOSIS — E785 Hyperlipidemia, unspecified: Secondary | ICD-10-CM | POA: Diagnosis not present

## 2023-07-12 DIAGNOSIS — R972 Elevated prostate specific antigen [PSA]: Secondary | ICD-10-CM | POA: Diagnosis not present

## 2023-07-12 DIAGNOSIS — R7303 Prediabetes: Secondary | ICD-10-CM

## 2023-07-12 LAB — COMPREHENSIVE METABOLIC PANEL
ALT: 18 U/L (ref 0–53)
AST: 26 U/L (ref 0–37)
Albumin: 4 g/dL (ref 3.5–5.2)
Alkaline Phosphatase: 58 U/L (ref 39–117)
BUN: 17 mg/dL (ref 6–23)
CO2: 30 meq/L (ref 19–32)
Calcium: 8.9 mg/dL (ref 8.4–10.5)
Chloride: 106 meq/L (ref 96–112)
Creatinine, Ser: 0.95 mg/dL (ref 0.40–1.50)
GFR: 79.06 mL/min (ref >60.00)
Glucose, Bld: 100 mg/dL — ABNORMAL HIGH (ref 70–99)
Potassium: 4.8 meq/L (ref 3.5–5.1)
Sodium: 142 meq/L (ref 135–145)
Total Bilirubin: 0.9 mg/dL (ref 0.2–1.2)
Total Protein: 6.3 g/dL (ref 6.0–8.3)

## 2023-07-12 LAB — HEMOGLOBIN A1C: Hgb A1c MFr Bld: 5.7 % (ref 4.6–6.5)

## 2023-07-12 LAB — MICROALBUMIN / CREATININE URINE RATIO
Creatinine,U: 145.4 mg/dL
Microalb Creat Ratio: 14.6 mg/g (ref 0.0–30.0)
Microalb, Ur: 2.1 mg/dL — ABNORMAL HIGH (ref 0.0–1.9)

## 2023-07-12 LAB — PSA, MEDICARE: PSA: 2.04 ng/mL (ref 0.10–4.00)

## 2023-07-13 LAB — LIPID PANEL W/REFLEX DIRECT LDL
Cholesterol: 137 mg/dL (ref <200)
HDL: 40 mg/dL (ref >=40)
LDL Cholesterol (Calc): 73 mg/dL
Non-HDL Cholesterol (Calc): 97 mg/dL (ref <130)
Total CHOL/HDL Ratio: 3.4 (calc) (ref <5.0)
Triglycerides: 153 mg/dL — ABNORMAL HIGH (ref <150)

## 2023-07-14 ENCOUNTER — Encounter: Payer: Self-pay | Admitting: Internal Medicine

## 2023-07-15 ENCOUNTER — Ambulatory Visit (INDEPENDENT_AMBULATORY_CARE_PROVIDER_SITE_OTHER): Admitting: Internal Medicine

## 2023-07-15 ENCOUNTER — Encounter: Payer: Self-pay | Admitting: Internal Medicine

## 2023-07-15 VITALS — BP 120/70 | HR 65 | Ht 72.0 in | Wt 219.0 lb

## 2023-07-15 DIAGNOSIS — I10 Essential (primary) hypertension: Secondary | ICD-10-CM | POA: Diagnosis not present

## 2023-07-15 DIAGNOSIS — E785 Hyperlipidemia, unspecified: Secondary | ICD-10-CM | POA: Diagnosis not present

## 2023-07-15 DIAGNOSIS — Z23 Encounter for immunization: Secondary | ICD-10-CM | POA: Diagnosis not present

## 2023-07-15 DIAGNOSIS — Z0000 Encounter for general adult medical examination without abnormal findings: Secondary | ICD-10-CM | POA: Diagnosis not present

## 2023-07-15 DIAGNOSIS — R972 Elevated prostate specific antigen [PSA]: Secondary | ICD-10-CM

## 2023-07-15 DIAGNOSIS — R7303 Prediabetes: Secondary | ICD-10-CM

## 2023-07-15 DIAGNOSIS — Z79899 Other long term (current) drug therapy: Secondary | ICD-10-CM

## 2023-07-15 MED ORDER — AMLODIPINE BESYLATE 10 MG PO TABS: 10.0000 mg | ORAL_TABLET | Freq: Every day | ORAL | 0 refills | Status: DC

## 2023-07-15 MED ORDER — LOSARTAN POTASSIUM 100 MG PO TABS: 100.0000 mg | ORAL_TABLET | Freq: Every day | ORAL | 1 refills | Status: DC

## 2023-07-15 NOTE — Assessment & Plan Note (Signed)

## 2023-07-15 NOTE — Patient Instructions (Signed)
 Your labs  have been reviewed and there are no significant or worrisome findings .  Please continue your current medications and  plan to repeat  Non fasting labs and an OV for follow up on hypertension  in 6 months.

## 2023-07-15 NOTE — Progress Notes (Unsigned)
 Patient ID: Gerald Flores, male    DOB: 04-20-1951  Age: 73 y.o. MRN: [id number]  The patient is here for annual preventive examination and management of other chronic and acute problems.   The risk factors are reflected in the social history.   The roster of all physicians providing medical care to patient - is listed in the Snapshot section of the chart.   Activities of daily living:  The patient is 100% independent in all ADLs: dressing, toileting, feeding as well as independent mobility   Home safety : The patient has smoke detectors in the home. They wear seatbelts.  There are no unsecured firearms at home. There is no violence in the home.    There is no risks for hepatitis, STDs or HIV. There is no   history of blood transfusion. They have no travel history to infectious disease endemic areas of the world.   The patient has seen their dentist in the last six month. They have seen their eye doctor in the last year. The patinet  denies slight hearing difficulty with regard to whispered voices and some television programs.  They have deferred audiologic testing in the last year.  They do not  have excessive sun exposure. Discussed the need for sun protection: hats, long sleeves and use of sunscreen if there is significant sun exposure.    Diet: the importance of a healthy diet is discussed. They do have a healthy diet.   The benefits of regular aerobic exercise were discussed. The patient  exercises  3 to 5 days per week  for  60 minutes.    Depression screen: there are no signs or vegative symptoms of depression- irritability, change in appetite, anhedonia, sadness/tearfullness.   The following portions of the patient's history were reviewed and updated as appropriate: allergies, current medications, past family history, past medical history,  past surgical history, past social history  and problem list.   Visual acuity was not assessed per patient preference since the patient has regular  follow up with an  ophthalmologist. Hearing and body mass index were assessed and reviewed.    During the course of the visit the patient was educated and counseled about appropriate screening and preventive services including : fall prevention , diabetes screening, nutrition counseling, colorectal cancer screening, and recommended immunizations.    Chief Complaint:  None   Review of Symptoms  Patient denies headache, fevers, malaise, unintentional weight loss, skin rash, eye pain, sinus congestion and sinus pain, sore throat, dysphagia,  hemoptysis , cough, dyspnea, wheezing, chest pain, palpitations, orthopnea, edema, abdominal pain, nausea, melena, diarrhea, constipation, flank pain, dysuria, hematuria, urinary  Frequency, nocturia, numbness, tingling, seizures,  Focal weakness, Loss of consciousness,  Tremor, insomnia, depression, anxiety, and suicidal ideation.    Physical Exam:  BP 120/70   Pulse 65   Ht 6' (1.829 m)   Wt 219 lb (99.3 kg)   SpO2 96%   BMI 29.70 kg/m    Physical Exam Vitals reviewed.  Constitutional:      General: He is not in acute distress.    Appearance: Normal appearance. He is normal weight. He is not ill-appearing, toxic-appearing or diaphoretic.  HENT:     Head: Normocephalic and atraumatic.     Right Ear: Tympanic membrane, ear canal and external ear normal. There is no impacted cerumen.     Left Ear: Tympanic membrane, ear canal and external ear normal. There is no impacted cerumen.     Nose: Nose normal.  Mouth/Throat:     Mouth: Mucous membranes are moist.     Pharynx: Oropharynx is clear.  Eyes:     General: No scleral icterus.       Right eye: No discharge.        Left eye: No discharge.     Conjunctiva/sclera: Conjunctivae normal.  Neck:     Thyroid: No thyromegaly.     Vascular: No carotid bruit or JVD.  Cardiovascular:     Rate and Rhythm: Normal rate and regular rhythm.     Heart sounds: Normal heart sounds.  Pulmonary:      Effort: Pulmonary effort is normal. No respiratory distress.     Breath sounds: Normal breath sounds.  Abdominal:     General: Bowel sounds are normal.     Palpations: Abdomen is soft. There is no mass.     Tenderness: There is no abdominal tenderness. There is no guarding or rebound.  Musculoskeletal:        General: Normal range of motion.     Cervical back: Normal range of motion and neck supple.  Lymphadenopathy:     Cervical: No cervical adenopathy.  Skin:    General: Skin is warm and dry.  Neurological:     General: No focal deficit present.     Mental Status: He is alert and oriented to person, place, and time. Mental status is at baseline.  Psychiatric:        Mood and Affect: Mood normal.        Behavior: Behavior normal.        Thought Content: Thought content normal.        Judgment: Judgment normal.    Assessment and Plan: Long-term use of high-risk medication -     Comprehensive metabolic panel; Future  Visit for preventive health examination Assessment & Plan: age appropriate education and counseling updated, referrals for preventative services and immunizations addressed, dietary and smoking counseling addressed, most recent labs reviewed.  I have personally reviewed and have noted:   1) the patient's medical and social history 2) The pt's use of alcohol, tobacco, and illicit drugs 3) The patient's current medications and supplements 4) Functional ability including ADL's, fall risk, home safety risk, hearing and visual impairment 5) Diet and physical activities 6) Evidence for depression or mood disorder 7) The patient's height, weight, and BMI have been recorded in the chart  I have made referrals, and provided counseling and education based on review of the above    Need for pneumococcal 20-valent conjugate vaccination -     Pneumococcal conjugate vaccine 20-valent  Primary hypertension Assessment & Plan: BP is at goal on 100 mg losartan ,  amlodipine to  10 mg.     Increased prostate specific antigen (PSA) velocity Assessment & Plan: Continue annual  follow up with Dr Richardson Landry   Lab Results  Component Value Date   PSA 2.04 07/12/2023   PSA 1.53 03/07/2022   PSA 3.20 11/18/2020       Hyperlipidemia with target LDL less than 100 Assessment & Plan: Mild,  With LDL has been at goal on current dose of ATORVASTATIN.   Lab Results  Component Value Date   CHOL 137 07/12/2023   HDL 40 07/12/2023   LDLCALC 73 07/12/2023   LDLDIRECT 98.0 03/07/2022   TRIG 153 (H) 07/12/2023   CHOLHDL 3.4 07/12/2023   Lab Results  Component Value Date   ALT 18 07/12/2023   AST 26 07/12/2023   ALKPHOS 58  07/12/2023   BILITOT 0.9 07/12/2023       Prediabetes Assessment & Plan: He has lowered a1c from 6.2 to 5.7 with healthier diet .   Encouraged to continue dietary modifications including restriction of sweets and sodas. Repeat labs reviewed.    Lab Results  Component Value Date   HGBA1C 5.7 07/12/2023      Other orders -     amLODIPine Besylate; Take 1 tablet (10 mg total) by mouth daily.  Dispense: 90 tablet; Refill: 0 -     Losartan Potassium; Take 1 tablet (100 mg total) by mouth daily.  Dispense: 90 tablet; Refill: 1    Return in about 6 months (around 01/15/2024).  Sherlene Shams, MD

## 2023-07-16 NOTE — Assessment & Plan Note (Signed)
 Mild,  With LDL has been at goal on current dose of ATORVASTATIN.   Lab Results  Component Value Date   CHOL 137 07/12/2023   HDL 40 07/12/2023   LDLCALC 73 07/12/2023   LDLDIRECT 98.0 03/07/2022   TRIG 153 (H) 07/12/2023   CHOLHDL 3.4 07/12/2023   Lab Results  Component Value Date   ALT 18 07/12/2023   AST 26 07/12/2023   ALKPHOS 58 07/12/2023   BILITOT 0.9 07/12/2023

## 2023-07-16 NOTE — Assessment & Plan Note (Signed)
 He has lowered a1c from 6.2 to 5.7 with healthier diet .   Encouraged to continue dietary modifications including restriction of sweets and sodas. Repeat labs reviewed.    Lab Results  Component Value Date   HGBA1C 5.7 07/12/2023

## 2023-07-16 NOTE — Assessment & Plan Note (Signed)
 Continue annual  follow up with Dr Richardson Landry   Lab Results  Component Value Date   PSA 2.04 07/12/2023   PSA 1.53 03/07/2022   PSA 3.20 11/18/2020

## 2023-07-16 NOTE — Assessment & Plan Note (Signed)
 BP is at goal on 100 mg losartan ,  amlodipine to 10 mg.

## 2023-07-25 DIAGNOSIS — G3184 Mild cognitive impairment, so stated: Secondary | ICD-10-CM | POA: Diagnosis not present

## 2023-07-25 DIAGNOSIS — G5603 Carpal tunnel syndrome, bilateral upper limbs: Secondary | ICD-10-CM | POA: Diagnosis not present

## 2023-08-14 DIAGNOSIS — H04123 Dry eye syndrome of bilateral lacrimal glands: Secondary | ICD-10-CM | POA: Diagnosis not present

## 2023-08-14 DIAGNOSIS — H43813 Vitreous degeneration, bilateral: Secondary | ICD-10-CM | POA: Diagnosis not present

## 2023-08-14 DIAGNOSIS — H2513 Age-related nuclear cataract, bilateral: Secondary | ICD-10-CM | POA: Diagnosis not present

## 2023-09-04 ENCOUNTER — Encounter (HOSPITAL_COMMUNITY): Payer: Self-pay

## 2023-09-11 ENCOUNTER — Ambulatory Visit

## 2023-10-09 DIAGNOSIS — C678 Malignant neoplasm of overlapping sites of bladder: Secondary | ICD-10-CM | POA: Diagnosis not present

## 2023-12-18 DIAGNOSIS — E781 Pure hyperglyceridemia: Secondary | ICD-10-CM | POA: Diagnosis not present

## 2023-12-18 DIAGNOSIS — I77810 Thoracic aortic ectasia: Secondary | ICD-10-CM | POA: Diagnosis not present

## 2023-12-18 DIAGNOSIS — I10 Essential (primary) hypertension: Secondary | ICD-10-CM | POA: Diagnosis not present

## 2023-12-18 DIAGNOSIS — R0602 Shortness of breath: Secondary | ICD-10-CM | POA: Diagnosis not present

## 2023-12-23 ENCOUNTER — Other Ambulatory Visit: Payer: Self-pay | Admitting: Cardiology

## 2023-12-23 DIAGNOSIS — I77810 Thoracic aortic ectasia: Secondary | ICD-10-CM

## 2023-12-23 DIAGNOSIS — I10 Essential (primary) hypertension: Secondary | ICD-10-CM

## 2023-12-26 ENCOUNTER — Ambulatory Visit
Admission: RE | Admit: 2023-12-26 | Discharge: 2023-12-26 | Disposition: A | Discharge status: Home / Self Care | Source: Ambulatory Visit | Attending: Cardiology | Admitting: Cardiology

## 2023-12-26 DIAGNOSIS — I77810 Thoracic aortic ectasia: Secondary | ICD-10-CM | POA: Diagnosis not present

## 2023-12-26 DIAGNOSIS — I10 Essential (primary) hypertension: Secondary | ICD-10-CM | POA: Diagnosis not present

## 2023-12-26 DIAGNOSIS — I517 Cardiomegaly: Secondary | ICD-10-CM | POA: Diagnosis not present

## 2023-12-26 MED ORDER — GADOBUTROL 1 MMOL/ML IV SOLN
10.0000 mL | Freq: Once | INTRAVENOUS | Status: AC | PRN
  Administered 2023-12-26: 10 mL via INTRAVENOUS

## 2024-01-01 ENCOUNTER — Other Ambulatory Visit: Payer: Self-pay | Admitting: Internal Medicine

## 2024-01-10 ENCOUNTER — Other Ambulatory Visit (INDEPENDENT_AMBULATORY_CARE_PROVIDER_SITE_OTHER)

## 2024-01-10 DIAGNOSIS — Z79899 Other long term (current) drug therapy: Secondary | ICD-10-CM

## 2024-01-10 LAB — COMPREHENSIVE METABOLIC PANEL WITH GFR
ALT: 19 U/L (ref 0–53)
AST: 31 U/L (ref 0–37)
Albumin: 4 g/dL (ref 3.5–5.2)
Alkaline Phosphatase: 65 U/L (ref 39–117)
BUN: 19 mg/dL (ref 6–23)
CO2: 30 meq/L (ref 19–32)
Calcium: 9 mg/dL (ref 8.4–10.5)
Chloride: 103 meq/L (ref 96–112)
Creatinine, Ser: 0.96 mg/dL (ref 0.40–1.50)
GFR: 77.8 mL/min (ref >60.00)
Glucose, Bld: 96 mg/dL (ref 70–99)
Potassium: 4.1 meq/L (ref 3.5–5.1)
Sodium: 140 meq/L (ref 135–145)
Total Bilirubin: 0.6 mg/dL (ref 0.2–1.2)
Total Protein: 6.4 g/dL (ref 6.0–8.3)

## 2024-01-12 ENCOUNTER — Ambulatory Visit: Payer: Self-pay | Admitting: Internal Medicine

## 2024-01-15 ENCOUNTER — Encounter: Payer: Self-pay | Admitting: Internal Medicine

## 2024-01-15 ENCOUNTER — Ambulatory Visit: Admitting: Internal Medicine

## 2024-01-15 VITALS — BP 130/70 | HR 55 | Temp 97.4°F | Ht 72.0 in | Wt 215.8 lb

## 2024-01-15 DIAGNOSIS — I2584 Coronary atherosclerosis due to calcified coronary lesion: Secondary | ICD-10-CM | POA: Diagnosis not present

## 2024-01-15 DIAGNOSIS — C679 Malignant neoplasm of bladder, unspecified: Secondary | ICD-10-CM

## 2024-01-15 DIAGNOSIS — R5383 Other fatigue: Secondary | ICD-10-CM

## 2024-01-15 DIAGNOSIS — I77810 Thoracic aortic ectasia: Secondary | ICD-10-CM | POA: Diagnosis not present

## 2024-01-15 DIAGNOSIS — R7303 Prediabetes: Secondary | ICD-10-CM | POA: Diagnosis not present

## 2024-01-15 DIAGNOSIS — G3184 Mild cognitive impairment, so stated: Secondary | ICD-10-CM

## 2024-01-15 DIAGNOSIS — R972 Elevated prostate specific antigen [PSA]: Secondary | ICD-10-CM | POA: Diagnosis not present

## 2024-01-15 DIAGNOSIS — I10 Essential (primary) hypertension: Secondary | ICD-10-CM | POA: Diagnosis not present

## 2024-01-15 DIAGNOSIS — I2510 Atherosclerotic heart disease of native coronary artery without angina pectoris: Secondary | ICD-10-CM

## 2024-01-15 DIAGNOSIS — R202 Paresthesia of skin: Secondary | ICD-10-CM | POA: Diagnosis not present

## 2024-01-15 DIAGNOSIS — R200 Anesthesia of skin: Secondary | ICD-10-CM

## 2024-01-15 DIAGNOSIS — E785 Hyperlipidemia, unspecified: Secondary | ICD-10-CM

## 2024-01-15 DIAGNOSIS — Z1211 Encounter for screening for malignant neoplasm of colon: Secondary | ICD-10-CM

## 2024-01-15 DIAGNOSIS — G5603 Carpal tunnel syndrome, bilateral upper limbs: Secondary | ICD-10-CM

## 2024-01-15 LAB — LIPID PANEL
Cholesterol: 160 mg/dL (ref 0–200)
HDL: 39.7 mg/dL (ref >39.00)
LDL Cholesterol: 81 mg/dL (ref 0–99)
NonHDL: 120.09
Total CHOL/HDL Ratio: 4
Triglycerides: 193 mg/dL — ABNORMAL HIGH (ref 0.0–149.0)
VLDL: 38.6 mg/dL (ref 0.0–40.0)

## 2024-01-15 LAB — CBC WITH DIFFERENTIAL/PLATELET
Basophils Absolute: 0 K/uL (ref 0.0–0.1)
Basophils Relative: 0.9 % (ref 0.0–3.0)
Eosinophils Absolute: 0.1 K/uL (ref 0.0–0.7)
Eosinophils Relative: 2.8 % (ref 0.0–5.0)
HCT: 40.4 % (ref 39.0–52.0)
Hemoglobin: 13.4 g/dL (ref 13.0–17.0)
Lymphocytes Relative: 31.8 % (ref 12.0–46.0)
Lymphs Abs: 1.7 K/uL (ref 0.7–4.0)
MCHC: 33.2 g/dL (ref 30.0–36.0)
MCV: 91 fl (ref 78.0–100.0)
Monocytes Absolute: 0.6 K/uL (ref 0.1–1.0)
Monocytes Relative: 11.6 % (ref 3.0–12.0)
Neutro Abs: 2.8 K/uL (ref 1.4–7.7)
Neutrophils Relative %: 52.9 % (ref 43.0–77.0)
Platelets: 253 K/uL (ref 150.0–400.0)
RBC: 4.44 Mil/uL (ref 4.22–5.81)
RDW: 13.3 % (ref 11.5–15.5)
WBC: 5.3 K/uL (ref 4.0–10.5)

## 2024-01-15 LAB — HEMOGLOBIN A1C: Hgb A1c MFr Bld: 6.1 % (ref 4.6–6.5)

## 2024-01-15 LAB — LDL CHOLESTEROL, DIRECT: Direct LDL: 81 mg/dL

## 2024-01-15 LAB — PSA, MEDICARE: PSA: 1.59 ng/mL (ref 0.10–4.00)

## 2024-01-15 LAB — TSH: TSH: 1.48 u[IU]/mL (ref 0.35–5.50)

## 2024-01-15 MED ORDER — ATORVASTATIN CALCIUM 40 MG PO TABS: 40.0000 mg | ORAL_TABLET | Freq: Every day | ORAL | 3 refills | Status: DC

## 2024-01-15 MED ORDER — LOSARTAN POTASSIUM 100 MG PO TABS: 100.0000 mg | ORAL_TABLET | Freq: Every day | ORAL | 1 refills | Status: AC

## 2024-01-15 MED ORDER — HYDROCHLOROTHIAZIDE 12.5 MG PO CAPS: 12.5000 mg | ORAL_CAPSULE | Freq: Every day | ORAL | 1 refills | Status: AC

## 2024-01-15 MED ORDER — NITROGLYCERIN 0.4 MG SL SUBL: 0.4000 mg | SUBLINGUAL_TABLET | SUBLINGUAL | 3 refills | Status: AC | PRN

## 2024-01-15 NOTE — Patient Instructions (Addendum)
 Please start taking a baby aspirin (81 mg dose) AT LEAST ONCE A WEEK to help protect you from a heart attack  If your LDL is > 70 today I will increase your lipitor dose as well   Goal BP is 120/70,  because of your aortic dilation  (lower blood pressure keeps it from dilating)  I am adding hct 12.5 mg daily in the morning to your current regimen   The CDC now recommends that all adults get vaccinated against Hepatitis B as soon as possible.    Hepatitis B infection can cause liver failure  and other chronic and life threatening conditions and is contagious (can be spread by blood products and body fluids) .  This can be done here (if you have private insurance) or at your pharmacy (if you have Medicare/Medicaid) It requires a 2nd dose after one month

## 2024-01-15 NOTE — Progress Notes (Signed)
 Subjective:  Patient ID: Gerald Flores, male    DOB: August 27, 1950  Age: 73 y.o. MRN: [id number]  CC: The primary encounter diagnosis was Primary hypertension. Diagnoses of Hyperlipidemia with target LDL less than 100, Fatigue, unspecified type, Prediabetes, Colon cancer screening, Malignant neoplasm of urinary bladder, unspecified site [facility]), Increased prostate specific antigen (PSA) velocity, Numbness and tingling in both hands, MCI (mild cognitive impairment), Carpal tunnel syndrome on both sides, Acquired dilation of ascending aorta and aortic root (HCC), and Coronary artery disease due to calcified coronary lesion were also pertinent to this visit.   HPI Gerald Flores presents for  Chief Complaint  Patient presents with   Medical Management of Chronic Issues    6 month follow up    1) BLADDER CANCER:  managed by Urologist [name] . LAST VISIT IN JUNE with cystoscopy normal per patient.  Undergoing surveillance with cysto every 6 months.  NO REPORT  AVAILABLE >  SURGICAL /TREATMENT HISTORY: Cystotomy W/Excision Bladder Tumor (1994);  cystourethroscopy w/fulguration &/or resection bladder tumor (N/A, 09/24/2019); cystourethroscopy w/ureteral cathization w/wo retrograde pyelogram (Bilateral, 09/24/2019); cystourethroscopy w/insertion/exchange ureteral stent (Right, 09/24/2019); and intraoperative flouroscopy (Bilateral, 09/24/2019).    2) SEEING [facility] FOR MCI AND CTS BILATERAL.  SCREENING TESTS FOR ALZHEIMERS WERE MILDLY POSITVE . Remains IADL and competent to manage his own affairs.    3)  Hypertension: patient checks blood pressure twice weekly at home.  Patient is following a reduced salt diet most days and is taking medications as prescribed HOME READINGS 126 TO 150. Diastolics range from 70 TO 80  Outpatient Medications Prior to Visit  Medication Sig Dispense Refill   amLODipine  (NORVASC ) 10 MG tablet TAKE 1 TABLET BY MOUTH EVERY DAY 90 tablet 1   Cholecalciferol (VITAMIN D3) 1000  units CAPS Take 1 capsule by mouth daily.     colchicine  0.6 MG tablet Take 1 tablet (0.6 mg total) by mouth 2 (two) times daily. For 7 days, as needed  for gout flare 60 tablet 1   escitalopram (LEXAPRO) 5 MG tablet Take 5 mg by mouth daily.     Multiple Vitamin (MULTIVITAMIN) tablet Take 1 tablet by mouth daily.     nitroGLYCERIN  (NITROSTAT ) 0.4 MG SL tablet Place 1 tablet (0.4 mg total) under the tongue every 5 (five) minutes as needed for chest pain. Max dose 3 tablets 20 tablet 3   atorvastatin  (LIPITOR) 40 MG tablet Take 1 tablet (40 mg total) by mouth daily. 90 tablet 3   losartan  (COZAAR ) 100 MG tablet Take 1 tablet (100 mg total) by mouth daily. 90 tablet 1   No facility-administered medications prior to visit.    Review of Systems;  Patient denies headache, fevers, malaise, unintentional weight loss, skin rash, eye pain, sinus congestion and sinus pain, sore throat, dysphagia,  hemoptysis , cough, dyspnea, wheezing, chest pain, palpitations, orthopnea, edema, abdominal pain, nausea, melena, diarrhea, constipation, flank pain, dysuria, hematuria, urinary  Frequency, nocturia, numbness, tingling, seizures,  Focal weakness, Loss of consciousness,  Tremor, insomnia, depression, anxiety, and suicidal ideation.      Objective:  BP 130/70   Pulse (!) 55   Temp (!) 97.4 F (36.3 C) (Oral)   Ht 6' (1.829 m)   Wt 215 lb 12.8 oz (97.9 kg)   SpO2 97%   BMI 29.27 kg/m   BP Readings from Last 3 Encounters:  01/15/24 130/70  07/15/23 120/70  02/27/23 136/74    Wt Readings from Last 3 Encounters:  01/15/24 215 lb  12.8 oz (97.9 kg)  07/15/23 219 lb (99.3 kg)  02/27/23 217 lb (98.4 kg)    Physical Exam Vitals reviewed.  Constitutional:      General: He is not in acute distress.    Appearance: Normal appearance. He is normal weight. He is not ill-appearing, toxic-appearing or diaphoretic.  HENT:     Head: Normocephalic.  Eyes:     General: No scleral icterus.       Right eye:  No discharge.        Left eye: No discharge.     Conjunctiva/sclera: Conjunctivae normal.  Cardiovascular:     Rate and Rhythm: Normal rate and regular rhythm.     Heart sounds: Normal heart sounds.  Pulmonary:     Effort: Pulmonary effort is normal. No respiratory distress.     Breath sounds: Normal breath sounds.  Musculoskeletal:        General: Normal range of motion.     Cervical back: Normal range of motion.  Skin:    General: Skin is warm and dry.  Neurological:     General: No focal deficit present.     Mental Status: He is alert and oriented to person, place, and time. Mental status is at baseline.  Psychiatric:        Mood and Affect: Mood normal.        Behavior: Behavior normal.        Thought Content: Thought content normal.        Judgment: Judgment normal.     Lab Results  Component Value Date   HGBA1C 6.1 01/15/2024   HGBA1C 5.7 07/12/2023   HGBA1C 5.9 03/07/2022    Lab Results  Component Value Date   CREATININE 0.96 01/10/2024   CREATININE 0.95 07/12/2023   CREATININE 0.85 07/09/2022    Lab Results  Component Value Date   WBC 5.3 01/15/2024   HGB 13.4 01/15/2024   HCT 40.4 01/15/2024   PLT 253.0 01/15/2024   GLUCOSE 96 01/10/2024   CHOL 160 01/15/2024   TRIG 193.0 (H) 01/15/2024   HDL 39.70 01/15/2024   LDLDIRECT 81.0 01/15/2024   LDLCALC 81 01/15/2024   ALT 19 01/10/2024   AST 31 01/10/2024   NA 140 01/10/2024   K 4.1 01/10/2024   CL 103 01/10/2024   CREATININE 0.96 01/10/2024   BUN 19 01/10/2024   CO2 30 01/10/2024   TSH 1.48 01/15/2024   PSA 1.59 01/15/2024   HGBA1C 6.1 01/15/2024   MICROALBUR 2.1 (H) 07/12/2023    MR ANGIO CHEST W WO CONTRAST Result Date: 12/27/2023 CLINICAL DATA:  Dilated ascending thoracic aorta by prior chest CT. EXAM: MRA CHEST WITH OR WITHOUT CONTRAST TECHNIQUE: Angiographic images of the chest were obtained using MRA technique with intravenous contrast. CONTRAST:  10mL GADAVIST  GADOBUTROL  1 MMOL/ML IV SOLN  COMPARISON:  CT chest 04/12/2022 FINDINGS: VASCULAR Aorta: The aortic root is mildly dilated measuring up to approximately 4.1-4.2 cm by MRI at the level of the sinuses of Valsalva. The ascending thoracic aorta is top-normal/mildly dilated in size measuring approximately 3.9-4 cm. The proximal arch measures 3.4 cm and the distal arch 2.9 cm. The descending thoracic aorta measures 2.9 cm. No evidence of aortic dissection. Visualized proximal great vessels demonstrate normal patency and bovine branching anatomy. Heart: The heart is mildly enlarged. No pericardial fluid identified. Pulmonary Arteries: Central pulmonary arteries are mildly dilated with the main pulmonary artery measuring up to 3.2 cm. NON-VASCULAR No incidental masses or lymphadenopathy. No pleural fluid identified.  Visualized bony structures and upper abdomen are on remarkable. IMPRESSION: 1. Mild dilatation of the aortic root measuring up to 4.1-4.2 cm at the level of the sinuses of Valsalva. 2. Top-normal/mildly dilated ascending thoracic aorta measuring 3.9-4 cm. Recommend annual imaging followup by CTA or MRA. This recommendation follows 2010 ACCF/AHA/AATS/ACR/ASA/SCA/SCAI/SIR/STS/SVM Guidelines for the Diagnosis and Management of Patients with Thoracic Aortic Disease. Circulation. 2010; 121: Z733-z630. Aortic aneurysm NOS (ICD10-I71.9) 3. Mild cardiomegaly. 4. Mildly dilated central pulmonary arteries with the main pulmonary artery measuring up to 3.2 cm. Electronically Signed   By: Marcey Moan M.D.   On: 12/27/2023 18:18    Assessment & Plan:  .Primary hypertension Assessment & Plan: BP is not at goal  of 120/70 on 100 mg losartan  ,  amlodipine  to 10 mg.  Adding hydrochlorothiazide  12.5 mg daily    Hyperlipidemia with target LDL less than 100 Assessment & Plan: Mild,  With LDL has been at goal on current dose of ATORVASTATIN .  Adding 81 mg asa  Lab Results  Component Value Date   CHOL 160 01/15/2024   HDL 39.70 01/15/2024    LDLCALC 81 01/15/2024   LDLDIRECT 81.0 01/15/2024   TRIG 193.0 (H) 01/15/2024   CHOLHDL 4 01/15/2024   Lab Results  Component Value Date   ALT 19 01/10/2024   AST 31 01/10/2024   ALKPHOS 65 01/10/2024   BILITOT 0.6 01/10/2024      Orders: -     Lipid panel -     LDL cholesterol, direct  Fatigue, unspecified type -     CBC with Differential/Platelet -     TSH  Prediabetes Assessment & Plan: He has lowered a1c from 6.2 to 5.7 with healthier diet .   Encouraged to continue dietary modifications including restriction of sweets and sodas. Repeat labs reviewed.    Lab Results  Component Value Date   HGBA1C 5.7 07/12/2023     Orders: -     Hemoglobin A1c  Colon cancer screening -     Cologuard  Malignant neoplasm of urinary bladder, unspecified site [facility])  Increased prostate specific antigen (PSA) velocity Assessment & Plan: he is under the care of urology for bladder CA.  Repeat PSA has improved.Continue annual  follow up with Dr Jefm .  Lab Results  Component Value Date   PSA 1.59 01/15/2024   PSA 2.04 07/12/2023   PSA 1.53 03/07/2022      Orders: -     PSA, Medicare  Numbness and tingling in both hands Assessment & Plan: Secondary to stage 3 CTS per neurology evaluation with EMG/NCS by Dr. Maree.    MCI (mild cognitive impairment) Assessment & Plan: Diagnosed by DR Maree  in 2024 Brain MRI and  screening labs for Alzheimers etiology reviewed.  No progression.  Remains IADL and competent to manage his own financial affairs    Carpal tunnel syndrome on both sides Assessment & Plan: S/p bilateral carpal tunnel injections - completed 09/27/2022 and 10/18/2022. S/p right carpal tunnel injection - completed 02/05/2023 - . He noted improvement following  injection in October 2024  with no side effects  his symptms have been stable with unchanged grip strength and hand coordination. He has no abnormal muscle movement or atrophy.     Acquired dilation of  ascending aorta and aortic root (HCC) Assessment & Plan: Counselled patient that BP goal is 120/70 or less.  Hydrochlorothiazide  added today    Coronary artery disease due to calcified coronary lesion Assessment & Plan: Advised  today to start taking a baby aspirin  AT LEAST twice weekly for primary prevention . Taking a statin, will increase dose of Lipitor for  LDL    >70.  He has had noninvasive cardiac evaluation for chest pain by Marolyn Dooms this year .  He has not used his NTG but his medication is out of date.   Lab Results  Component Value Date   CHOL 160 01/15/2024   HDL 39.70 01/15/2024   LDLCALC 81 01/15/2024   LDLDIRECT 81.0 01/15/2024   TRIG 193.0 (H) 01/15/2024   CHOLHDL 4 01/15/2024      Other orders -     Losartan  Potassium; Take 1 tablet (100 mg total) by mouth daily.  Dispense: 90 tablet; Refill: 1 -     hydroCHLOROthiazide ; Take 1 capsule (12.5 mg total) by mouth daily.  Dispense: 90 capsule; Refill: 1 -     Nitroglycerin ; Place 1 tablet (0.4 mg total) under the tongue every 5 (five) minutes as needed for chest pain. Max dose 3 tablets  Dispense: 20 tablet; Refill: 3 -     Atorvastatin  Calcium ; Take 1 tablet (80 mg total) by mouth daily.  Dispense: 90 tablet; Refill: 1 -     Aspirin ; Take 1 tablet (81 mg total) by mouth daily. Swallow whole.   I personally spent a total of 35 minutes in the care of the patient today including preparing to see the patient, getting/reviewing separately obtained history, performing a medically appropriate exam/evaluation, counseling and educating, placing orders, documenting clinical information in the EHR, and communicating results.   Follow-up: Return in about 6 months (around 07/14/2024).   Verneita LITTIE Kettering, MD

## 2024-01-15 NOTE — Assessment & Plan Note (Signed)
 He has lowered a1c from 6.2 to 5.7 with healthier diet .   Encouraged to continue dietary modifications including restriction of sweets and sodas. Repeat labs reviewed.    Lab Results  Component Value Date   HGBA1C 5.7 07/12/2023

## 2024-01-16 ENCOUNTER — Ambulatory Visit: Payer: Self-pay | Admitting: Internal Medicine

## 2024-01-18 DIAGNOSIS — I77810 Thoracic aortic ectasia: Secondary | ICD-10-CM | POA: Insufficient documentation

## 2024-01-18 DIAGNOSIS — I2510 Atherosclerotic heart disease of native coronary artery without angina pectoris: Secondary | ICD-10-CM | POA: Insufficient documentation

## 2024-01-18 MED ORDER — ASPIRIN 81 MG PO TBEC: 81.0000 mg | DELAYED_RELEASE_TABLET | Freq: Every day | ORAL | Status: AC

## 2024-01-18 MED ORDER — ATORVASTATIN CALCIUM 80 MG PO TABS: 80.0000 mg | ORAL_TABLET | Freq: Every day | ORAL | 1 refills | Status: AC

## 2024-01-18 NOTE — Assessment & Plan Note (Signed)
 he is under the care of urology for bladder CA.  Repeat PSA has improved.Continue annual  follow up with Dr Jefm .  Lab Results  Component Value Date   PSA 1.59 01/15/2024   PSA 2.04 07/12/2023   PSA 1.53 03/07/2022

## 2024-01-18 NOTE — Assessment & Plan Note (Addendum)
 BP is not at goal  of 120/70 on 100 mg losartan  ,  amlodipine  to 10 mg.  Adding hydrochlorothiazide  12.5 mg daily

## 2024-01-18 NOTE — Assessment & Plan Note (Signed)
 Secondary to stage 3 CTS per neurology evaluation with EMG/NCS by Dr. Maree.

## 2024-01-18 NOTE — Assessment & Plan Note (Signed)
 Counselled patient that BP goal is 120/70 or less.  Hydrochlorothiazide  added today

## 2024-01-18 NOTE — Assessment & Plan Note (Addendum)
 Diagnosed by DR Maree  in 2024 Brain MRI and  screening labs for Alzheimers etiology reviewed.  No progression.  Remains IADL and competent to manage his own financial affairs

## 2024-01-18 NOTE — Assessment & Plan Note (Signed)
 S/p bilateral carpal tunnel injections - completed 09/27/2022 and 10/18/2022. S/p right carpal tunnel injection - completed 02/05/2023 - . Gerald Flores noted improvement following  injection in October 2024  with no side effects  his symptms have been stable with unchanged grip strength and hand coordination. Gerald Flores has no abnormal muscle movement or atrophy.

## 2024-01-18 NOTE — Assessment & Plan Note (Addendum)
 Advised today to start taking a baby aspirin  AT LEAST twice weekly for primary prevention . Taking a statin, will increase dose of Lipitor for  LDL    >70.  He has had noninvasive cardiac evaluation for chest pain by Marolyn Dooms this year .  He has not used his NTG but his medication is out of date.   Lab Results  Component Value Date   CHOL 160 01/15/2024   HDL 39.70 01/15/2024   LDLCALC 81 01/15/2024   LDLDIRECT 81.0 01/15/2024   TRIG 193.0 (H) 01/15/2024   CHOLHDL 4 01/15/2024

## 2024-01-18 NOTE — Assessment & Plan Note (Signed)
 Mild,  With LDL has been at goal on current dose of ATORVASTATIN .  Adding 81 mg asa  Lab Results  Component Value Date   CHOL 160 01/15/2024   HDL 39.70 01/15/2024   LDLCALC 81 01/15/2024   LDLDIRECT 81.0 01/15/2024   TRIG 193.0 (H) 01/15/2024   CHOLHDL 4 01/15/2024   Lab Results  Component Value Date   ALT 19 01/10/2024   AST 31 01/10/2024   ALKPHOS 65 01/10/2024   BILITOT 0.6 01/10/2024

## 2024-01-18 NOTE — Assessment & Plan Note (Deleted)
 Chest CT 04/13/2022 revealed three-vessel coronary calcifications and , aortic atherosclerosis

## 2024-04-15 DIAGNOSIS — C678 Malignant neoplasm of overlapping sites of bladder: Secondary | ICD-10-CM | POA: Diagnosis not present

## 2024-07-15 ENCOUNTER — Ambulatory Visit: Admitting: Internal Medicine
